# Patient Record
Sex: Male | Born: 1980 | Race: White | Hispanic: No | Marital: Married | Smoking: Current every day smoker
Health system: Southern US, Community
[De-identification: ages and names within clinical notes are randomized; demographics above are authoritative.]

## PROBLEM LIST (undated history)

## (undated) DIAGNOSIS — T4145XA Adverse effect of unspecified anesthetic, initial encounter: Secondary | ICD-10-CM

## (undated) DIAGNOSIS — K219 Gastro-esophageal reflux disease without esophagitis: Secondary | ICD-10-CM

## (undated) DIAGNOSIS — S4292XA Fracture of left shoulder girdle, part unspecified, initial encounter for closed fracture: Secondary | ICD-10-CM

## (undated) DIAGNOSIS — T8859XA Other complications of anesthesia, initial encounter: Secondary | ICD-10-CM

## (undated) DIAGNOSIS — T300 Burn of unspecified body region, unspecified degree: Secondary | ICD-10-CM

## (undated) DIAGNOSIS — S329XXA Fracture of unspecified parts of lumbosacral spine and pelvis, initial encounter for closed fracture: Secondary | ICD-10-CM

---

## 2005-08-22 HISTORY — PX: TONSILLECTOMY: SUR1361

## 2005-09-13 ENCOUNTER — Ambulatory Visit (HOSPITAL_COMMUNITY): Admission: RE | Admit: 2005-09-13 | Discharge: 2005-09-13 | Payer: Self-pay | Admitting: Otolaryngology

## 2005-09-13 ENCOUNTER — Ambulatory Visit (HOSPITAL_BASED_OUTPATIENT_CLINIC_OR_DEPARTMENT_OTHER): Admission: RE | Admit: 2005-09-13 | Discharge: 2005-09-14 | Payer: Self-pay | Admitting: Otolaryngology

## 2012-07-23 ENCOUNTER — Emergency Department: Payer: Self-pay | Admitting: Emergency Medicine

## 2012-07-23 LAB — COMPREHENSIVE METABOLIC PANEL
Albumin: 4.2 g/dL (ref 3.4–5.0)
Alkaline Phosphatase: 87 U/L (ref 50–136)
Calcium, Total: 9.1 mg/dL (ref 8.5–10.1)
Glucose: 108 mg/dL — ABNORMAL HIGH (ref 65–99)
Osmolality: 276 (ref 275–301)
Sodium: 138 mmol/L (ref 136–145)

## 2012-07-23 LAB — CBC
MCH: 34.3 pg — ABNORMAL HIGH (ref 26.0–34.0)
MCV: 97 fL (ref 80–100)
Platelet: 180 10*3/uL (ref 150–440)
RBC: 4.8 10*6/uL (ref 4.40–5.90)
RDW: 13.4 % (ref 11.5–14.5)

## 2012-07-23 LAB — URINALYSIS, COMPLETE
Nitrite: NEGATIVE
Protein: 30
Specific Gravity: 1.023 (ref 1.003–1.030)
WBC UR: 1 /HPF (ref 0–5)

## 2012-07-23 LAB — LIPASE, BLOOD: Lipase: 98 U/L (ref 73–393)

## 2012-07-26 ENCOUNTER — Telehealth (INDEPENDENT_AMBULATORY_CARE_PROVIDER_SITE_OTHER): Payer: Self-pay | Admitting: General Surgery

## 2012-07-26 ENCOUNTER — Emergency Department (HOSPITAL_COMMUNITY): Payer: BC Managed Care – PPO

## 2012-07-26 ENCOUNTER — Encounter (HOSPITAL_COMMUNITY): Payer: Self-pay | Admitting: *Deleted

## 2012-07-26 ENCOUNTER — Emergency Department (HOSPITAL_COMMUNITY)
Admission: EM | Admit: 2012-07-26 | Discharge: 2012-07-26 | Disposition: A | Discharge status: Home / Self Care | Payer: BC Managed Care – PPO | Attending: Emergency Medicine | Admitting: Emergency Medicine

## 2012-07-26 DIAGNOSIS — R1011 Right upper quadrant pain: Secondary | ICD-10-CM

## 2012-07-26 DIAGNOSIS — K8020 Calculus of gallbladder without cholecystitis without obstruction: Secondary | ICD-10-CM | POA: Insufficient documentation

## 2012-07-26 DIAGNOSIS — F17200 Nicotine dependence, unspecified, uncomplicated: Secondary | ICD-10-CM | POA: Insufficient documentation

## 2012-07-26 LAB — CBC
Hemoglobin: 15.8 g/dL (ref 13.0–17.0)
MCH: 33.6 pg (ref 26.0–34.0)
MCV: 95.1 fL (ref 78.0–100.0)
RBC: 4.7 MIL/uL (ref 4.22–5.81)

## 2012-07-26 LAB — COMPREHENSIVE METABOLIC PANEL
ALT: 33 U/L (ref 0–53)
CO2: 27 mEq/L (ref 19–32)
Calcium: 9.5 mg/dL (ref 8.4–10.5)
Creatinine, Ser: 0.97 mg/dL (ref 0.50–1.35)
GFR calc Af Amer: >90 mL/min (ref >90)
GFR calc non Af Amer: >90 mL/min (ref >90)
Glucose, Bld: 97 mg/dL (ref 70–99)
Sodium: 139 mEq/L (ref 135–145)

## 2012-07-26 MED ORDER — HYDROMORPHONE HCL PF 1 MG/ML IJ SOLN
1.0000 mg | Freq: Once | INTRAMUSCULAR | Status: AC
  Administered 2012-07-26: 1 mg via INTRAVENOUS
  Filled 2012-07-26: qty 1

## 2012-07-26 MED ORDER — OXYCODONE HCL 5 MG PO TABS: 5.0000 mg | ORAL_TABLET | ORAL | Status: AC | PRN

## 2012-07-26 MED ORDER — SODIUM CHLORIDE 0.9 % IV SOLN
Freq: Once | INTRAVENOUS | Status: AC
  Administered 2012-07-26: 22:00:00 via INTRAVENOUS

## 2012-07-26 MED ORDER — SODIUM CHLORIDE 0.9 % IV BOLUS (SEPSIS)
1000.0000 mL | Freq: Once | INTRAVENOUS | Status: AC
  Administered 2012-07-26: 1000 mL via INTRAVENOUS

## 2012-07-26 MED ORDER — ONDANSETRON HCL 4 MG/2ML IJ SOLN
4.0000 mg | Freq: Once | INTRAMUSCULAR | Status: AC
  Administered 2012-07-26: 4 mg via INTRAVENOUS
  Filled 2012-07-26: qty 2

## 2012-07-26 MED ORDER — ONDANSETRON 8 MG PO TBDP: 8.0000 mg | ORAL_TABLET | Freq: Three times a day (TID) | ORAL | Status: AC | PRN

## 2012-07-26 NOTE — Telephone Encounter (Signed)
[name] (spouse) called in stated Steve [name] is in pain, was seen last week in an urgent office facility but was not given any medication for pain. He was told his gallbladder was inflamed. She wanted to know if there was an earlier appointment than what has already been set up with Dr. Michaell Cowing on 07/28/12. Checked system and there was not an available slot. Advised for him to try  Aleve or Advil for the pain until seen on Friday. And advised to not take any Tylenol.

## 2012-07-26 NOTE — ED Provider Notes (Addendum)
History     CSN: [id number]  Arrival date & time 07/26/12  1743   First MD Initiated Contact with Patient 07/26/12 1826      Chief Complaint  Patient presents with  . Abdominal Pain  . Cholelithiasis    (Consider location/radiation/quality/duration/timing/severity/associated sxs/prior treatment) The history is provided by the patient.   the patient reports a recent diagnosis of gallstones an outside hospital.  He reports development of severe pain with associated nausea that occurred today.  Said no fevers or chills.  Reports his pain is moderate to severe at this time and located in his right upper quadrant.  His pain continues across his abdomen at this time.  He's had no fevers or chills.  Has appointment with Gen. surgery in 2 days.  His pain became severe today and thus he presents to the emergency department for evaluation.  History reviewed. No pertinent past medical history.  History reviewed. No pertinent past surgical history.  History reviewed. No pertinent family history.  History  Substance Use Topics  . Smoking status: Current Everyday Smoker -- 2.0 packs/day for 10 years    Types: Cigarettes  . Smokeless tobacco: Not on file  . Alcohol Use: Yes     occasional      Review of Systems  Gastrointestinal: Positive for abdominal pain.  All other systems reviewed and are negative.    Allergies  Review of patient's allergies indicates no known allergies.  Home Medications   Current Outpatient Rx  Name Route Sig Dispense Refill  . NAPROXEN SODIUM 220 MG PO TABS Oral Take 1,100 mg by mouth once.     . OMEPRAZOLE 20 MG PO CPDR Oral Take 20 mg by mouth daily.      BP 158/91  Pulse 50  Temp 97.7 F (36.5 C) (Oral)  Resp 18  SpO2 98%  Physical Exam  Nursing note and vitals reviewed. Constitutional: He is oriented to person, place, and time. He appears well-developed and well-nourished.  HENT:  Head: Normocephalic and atraumatic.  Eyes: EOM are normal.    Neck: Normal range of motion.  Cardiovascular: Normal rate, regular rhythm, normal heart sounds and intact distal pulses.   Pulmonary/Chest: Effort normal and breath sounds normal. No respiratory distress.  Abdominal: Soft. He exhibits no distension.  Musculoskeletal: Normal range of motion.  Neurological: He is alert and oriented to person, place, and time.  Skin: Skin is warm and dry.  Psychiatric: He has a normal mood and affect. Judgment normal.    ED Course  Procedures (including critical care time)  Labs Reviewed  CBC - Abnormal; Notable for the following:    WBC 12.5 (*)     All other components within normal limits  COMPREHENSIVE METABOLIC PANEL  LIPASE, BLOOD   US Abdomen Complete  07/26/2012  *RADIOLOGY REPORT*  Clinical Data: Abdominal pain.  Cholelithiasis.  ABDOMEN ULTRASOUND  Technique:  Complete abdominal ultrasound examination was performed including evaluation of the liver, gallbladder, bile ducts, pancreas, kidneys, spleen, IVC, and abdominal aorta.  Comparison: No comparison studies available.  Findings:  Gallbladder:  Gallbladder sludge noted with an apparent 13 mm shadowing stone wedged in the neck of the gallbladder.  There is no gallbladder wall thickening or pericholecystic fluid.  The sonographer reports no sonographic Murphy's sign.  Common Bile Duct:  Nondilated at 2 - 3 mm diameter.  Liver:  Normal.  No focal parenchymal abnormality.  No biliary dilation.  IVC:  Normal.  Pancreas:  Normal.  Spleen:  Normal.  Right kidney:  11.0 cm in long axis.  Normal.  Left kidney:  12.2 cm in long axis.  Normal.  Abdominal Aorta:  No aneurysm.  IMPRESSION: 13 mm stone appears to be lodged in the neck of the gallbladder without gallbladder wall thickening or pericholecystic fluid.  The sonographer reports no sonographic Murphy's sign.   Original Report Authenticated By: ERIC A. MANSELL, M.D.     I personally reviewed the imaging tests through PACS system  I reviewed available  ER/hospitalization records thought the EMR   1. Cholelithiasis       MDM  9:57 PM Patient has ongoing pain at this time I'm having difficulty getting him pain-free.  I will consult general surgery for evaluation the bedside.  The patient would benefit from cholecystectomy    Lyanne Co, MD 07/26/12 2159  10:52 PM Surgery saw the pt. He wants to go home now. Home with [facility] follow on Friday as scheduled  Lyanne Co, MD 07/26/12 2253

## 2012-07-26 NOTE — Consult Note (Signed)
Reason for Consult:gallstones Referring Physician: [name] is an 31 y.o. male.  HPI: Asked to see pt at request of Steve [name] due to RUQ abdominal pain for last 6 hours.   Known history of gallstones. To see Steve [name] on Friday but pain worsened tonight.  Had attack on Sunday similar lasting hours.  Pain is severe and sharp but gone now.  Pt received pain meds.    History reviewed. No pertinent past medical history.  History reviewed. No pertinent past surgical history.  History reviewed. No pertinent family history.  Social History:  reports that he has been smoking Cigarettes.  He has a 20 pack-year smoking history. He does not have any smokeless tobacco history on file. He reports that he drinks alcohol. His drug history not on file.  Allergies: No Known Allergies  Medications: I have reviewed the patient's current medications.  Results for orders placed during the hospital encounter of 07/26/12 (from the past 48 hour(s))  CBC     Status: Abnormal   Collection Time   07/26/12  7:00 PM      Component Value Range Comment   WBC 12.5 (*) 4.0 - 10.5 K/uL    RBC 4.70  4.22 - 5.81 MIL/uL    Hemoglobin 15.8  13.0 - 17.0 g/dL    HCT 16.1  09.6 - 04.5 %    MCV 95.1  78.0 - 100.0 fL    MCH 33.6  26.0 - 34.0 pg    MCHC 35.3  30.0 - 36.0 g/dL    RDW 40.9  81.1 - 91.4 %    Platelets 198  150 - 400 K/uL   COMPREHENSIVE METABOLIC PANEL     Status: Normal   Collection Time   07/26/12  7:00 PM      Component Value Range Comment   Sodium 139  135 - 145 mEq/L    Potassium 3.8  3.5 - 5.1 mEq/L    Chloride 102  96 - 112 mEq/L    CO2 27  19 - 32 mEq/L    Glucose, Bld 97  70 - 99 mg/dL    BUN 13  6 - 23 mg/dL    Creatinine, Ser 7.82  0.50 - 1.35 mg/dL    Calcium 9.5  8.4 - 95.6 mg/dL    Total Protein 7.1  6.0 - 8.3 g/dL    Albumin 4.3  3.5 - 5.2 g/dL    AST 28  0 - 37 U/L    ALT 33  0 - 53 U/L    Alkaline Phosphatase 75  39 - 117 U/L    Total Bilirubin 0.3  0.3 - 1.2 mg/dL    GFR calc non Af Amer >90  >90 mL/min    GFR calc Af Amer >90  >90 mL/min   LIPASE, BLOOD     Status: Normal   Collection Time   07/26/12  7:00 PM      Component Value Range Comment   Lipase 33  11 - 59 U/L     US Abdomen Complete  07/26/2012  *RADIOLOGY REPORT*  Clinical Data: Abdominal pain.  Cholelithiasis.  ABDOMEN ULTRASOUND  Technique:  Complete abdominal ultrasound examination was performed including evaluation of the liver, gallbladder, bile ducts, pancreas, kidneys, spleen, IVC, and abdominal aorta.  Comparison: No comparison studies available.  Findings:  Gallbladder:  Gallbladder sludge noted with an apparent 13 mm shadowing stone wedged in the neck of the gallbladder.  There is no gallbladder wall  thickening or pericholecystic fluid.  The sonographer reports no sonographic Murphy's sign.  Common Bile Duct:  Nondilated at 2 - 3 mm diameter.  Liver:  Normal.  No focal parenchymal abnormality.  No biliary dilation.  IVC:  Normal.  Pancreas:  Normal.  Spleen:  Normal.  Right kidney:  11.0 cm in long axis.  Normal.  Left kidney:  12.2 cm in long axis.  Normal.  Abdominal Aorta:  No aneurysm.  IMPRESSION: 13 mm stone appears to be lodged in the neck of the gallbladder without gallbladder wall thickening or pericholecystic fluid.  The sonographer reports no sonographic Murphy's sign.   Original Report Authenticated By: ERIC A. MANSELL, M.D.     Review of Systems  Constitutional: Negative.   HENT: Negative.   Eyes: Negative.   Respiratory: Negative.   Cardiovascular: Negative.   Gastrointestinal: Positive for abdominal pain.  Genitourinary: Negative.   Musculoskeletal: Negative.   Skin: Negative.   Neurological: Negative.   Endo/Heme/Allergies: Negative.    Blood pressure 158/91, pulse 50, temperature 97.7 F (36.5 C), temperature source Oral, resp. rate 18, SpO2 98.00%. Physical Exam  Constitutional: He is oriented to person, place, and time. He appears well-developed and  well-nourished.  HENT:  Head: Normocephalic and atraumatic.  Eyes: EOM are normal. Pupils are equal, round, and reactive to light.  Neck: Normal range of motion. Neck supple.  Cardiovascular: Normal rate and regular rhythm.   Respiratory: Effort normal and breath sounds normal.  GI: Soft. Bowel sounds are normal. He exhibits no distension. There is no tenderness. There is no rebound and no guarding.  Musculoskeletal: Normal range of motion.  Neurological: He is alert and oriented to person, place, and time.  Skin: Skin is warm and dry.  Psychiatric: He has a normal mood and affect. His behavior is normal. Judgment and thought content normal.    Assessment/Plan: Symptomatic cholelithiasis.  I offered admission tonight  for lap cholecystectomy  Thursday but pt wishes to go home and keep appt with Steve [name] on Friday.  I told him to call if symptoms return or get worse.  Low fat diet recommended.  Risks of progression of disease include bleeding,  Infection ,  Open procedure.  Worsening of condition.  He and his wife understands but he wants to go home.    Steve [name] A. 07/26/2012, 10:55 PM

## 2012-07-26 NOTE — ED Notes (Signed)
Pt alert and oriented x4. Respirations even and unlabored, bilateral symmetrical rise and fall of chest. Skin warm and dry. In no acute distress. Denies needs.   

## 2012-07-26 NOTE — ED Notes (Signed)
Pt reports being seen at [facility] on Sunday 9/1, dx with gall stone and inflammation, pt has appointment with surgeon this Friday. Pt reports pain eased off since Monday and then returned this afternoon. Pain across upper abdomen 10/10. Denies n/v/d. Reports some lightheadedness. Occasionally drinker.

## 2012-07-26 NOTE — ED Notes (Signed)
md at bedside  Pt alert and oriented x4. Respirations even and unlabored, bilateral symmetrical rise and fall of chest. Skin warm and dry. In no acute distress. Denies needs.   

## 2012-07-28 ENCOUNTER — Encounter (INDEPENDENT_AMBULATORY_CARE_PROVIDER_SITE_OTHER): Payer: Self-pay | Admitting: Surgery

## 2012-07-28 ENCOUNTER — Ambulatory Visit (INDEPENDENT_AMBULATORY_CARE_PROVIDER_SITE_OTHER): Payer: BC Managed Care – PPO | Admitting: Surgery

## 2012-07-28 VITALS — BP 136/74 | HR 57 | Temp 97.7°F | Ht 74.0 in | Wt 238.0 lb

## 2012-07-28 DIAGNOSIS — K8010 Calculus of gallbladder with chronic cholecystitis without obstruction: Secondary | ICD-10-CM | POA: Insufficient documentation

## 2012-07-28 DIAGNOSIS — Z720 Tobacco use: Secondary | ICD-10-CM

## 2012-07-28 NOTE — Patient Instructions (Addendum)
Cholelithiasis Cholelithiasis (also called gallstones) is a form of gallbladder disease where gallstones form in your gallbladder. The gallbladder is a non-essential organ that stores bile made in the liver, which helps digest fats. Gallstones begin as small crystals and slowly grow into stones. Gallstone pain occurs when the gallbladder spasms, and a gallstone is blocking the duct. Pain can also occur when a stone passes out of the duct.  Women are more likely to develop gallstones than men. Other factors that increase the risk of gallbladder disease are:  Having multiple pregnancies. Physicians sometimes advise removing diseased gallbladders before future pregnancies.   Obesity.   Diets heavy in fried foods and fat.   Increasing age (older than 60).   Prolonged use of medications containing male hormones.   Diabetes mellitus.   Rapid weight loss.   Family history of gallstones (heredity).  SYMPTOMS  Feeling sick to your stomach (nauseous).   Abdominal pain.   Yellowing of the skin (jaundice).   Sudden pain. It may persist from several minutes to several hours.   Worsening pain with deep breathing or when jarred.   Fever.   Tenderness to the touch.  In some cases, when gallstones do not move into the bile duct, people have no pain or symptoms. These are called "silent" gallstones. TREATMENT In severe cases, emergency surgery may be required. HOME CARE INSTRUCTIONS   Only take over-the-counter or prescription medicines for pain, discomfort, or fever as directed by your caregiver.   Follow a low-fat diet until seen again. Fat causes the gallbladder to contract, which can result in pain.   Follow up as instructed. Attacks are almost always recurrent and surgery is usually required for permanent treatment.  SEEK IMMEDIATE MEDICAL CARE IF:   Your pain increases and is not controlled by medications.   You have an oral temperature above 102 F (38.9 C), not controlled by  medication.   You develop nausea and vomiting.  MAKE SURE YOU:   Understand these instructions.   Will watch your condition.   Will get help right away if you are not doing well or get worse.  Document Released: 11/04/2005 Document Revised: 10/28/2011 Document Reviewed: 01/07/2011 [employer] Patient Information 2012 [employer]. 

## 2012-07-28 NOTE — Progress Notes (Signed)
Subjective:     Patient ID: Steve [name], male   DOB: May 23, 1981, 31 y.o.   MRN: [id number]  HPI  Steve [name]  10-23-1981 [id number]  Patient Care Team: Provider Default, MD as PCP - General  This patient is a 32 y.o.male who presents today for surgical evaluation.   Reason for visit: Abdominal pain.  Probable gallstones etiology  Pleasant active smoking male.  Has chronic heartburn but usually well controlled.  He had a severe episode of upper abdominal pain.  Nausea and vomiting.  His son had an episode.  They thought was gastroenteritis.  His son had a lot of diarrhea.  He did not.  He went to the emergency room.  He is found to have gallstones.  CT scan did not show any cholecystitis or other abnormalities.  He had another episode after eating meatloaf a few days later.  Which emergency room.  So my partner, Dr. Luisa Hart.  Dr. Luisa Hart offered admission and surgery.  He was feeling better and wished to keep the appointment with me a few days later.  He denies any episodes of diarrhea.  Aside from the son no other sick contacts.  He does smoke but is rather active.  Eats a moderate amount of fast food.  No personal nor family history of GI/colon cancer, inflammatory bowel disease, irritable bowel syndrome, allergy such as Celiac Sprue, dietary/dairy problems, colitis, ulcers nor gastritis.  No other recent sick contacts/gastroenteritis.  No travel outside the country.  No changes in diet.    Patient Active Problem List  Diagnosis  . Chronic cholecystitis with calculus    History reviewed. No pertinent past medical history.  Past Surgical History  Procedure Date  . Tonsillectomy 08/2005    History   Social History  . Marital Status: Married    Spouse Name: N/A    Number of Children: N/A  . Years of Education: N/A   Occupational History  . Not on file.   Social History Main Topics  . Smoking status: Current Everyday Smoker -- 2.0 packs/day for 10 years    Types:  Cigarettes  . Smokeless tobacco: Not on file  . Alcohol Use: Yes     occasional  . Drug Use: No  . Sexually Active:    Other Topics Concern  . Not on file   Social History Narrative  . No narrative on file    Family History  Problem Relation Age of Onset  . Heart disease Father     Current Outpatient Prescriptions  Medication Sig Dispense Refill  . naproxen sodium (ANAPROX) 220 MG tablet Take 1,100 mg by mouth once.       Marland Kitchen omeprazole (PRILOSEC) 20 MG capsule Take 20 mg by mouth daily.      . ondansetron (ZOFRAN ODT) 8 MG disintegrating tablet Take 1 tablet (8 mg total) by mouth every 8 (eight) hours as needed for nausea.  10 tablet  0  . oxyCODONE (ROXICODONE) 5 MG immediate release tablet Take 1 tablet (5 mg total) by mouth every 4 (four) hours as needed for pain.  30 tablet  0     No Known Allergies  BP 136/74  Pulse 57  Temp 97.7 F (36.5 C) (Temporal)  Ht 6\' 2"  (1.88 m)  Wt 238 lb (107.956 kg)  BMI 30.56 kg/m2  SpO2 97%  US Abdomen Complete  07/26/2012  *RADIOLOGY REPORT*  Clinical Data: Abdominal pain.  Cholelithiasis.  ABDOMEN ULTRASOUND  Technique:  Complete abdominal ultrasound examination  was performed including evaluation of the liver, gallbladder, bile ducts, pancreas, kidneys, spleen, IVC, and abdominal aorta.  Comparison: No comparison studies available.  Findings:  Gallbladder:  Gallbladder sludge noted with an apparent 13 mm shadowing stone wedged in the neck of the gallbladder.  There is no gallbladder wall thickening or pericholecystic fluid.  The sonographer reports no sonographic Murphy's sign.  Common Bile Duct:  Nondilated at 2 - 3 mm diameter.  Liver:  Normal.  No focal parenchymal abnormality.  No biliary dilation.  IVC:  Normal.  Pancreas:  Normal.  Spleen:  Normal.  Right kidney:  11.0 cm in long axis.  Normal.  Left kidney:  12.2 cm in long axis.  Normal.  Abdominal Aorta:  No aneurysm.  IMPRESSION: 13 mm stone appears to be lodged in the neck of the  gallbladder without gallbladder wall thickening or pericholecystic fluid.  The sonographer reports no sonographic Murphy's sign.   Original Report Authenticated By: ERIC A. MANSELL, M.D.      Review of Systems  Constitutional: Negative for fever, chills and diaphoresis.  HENT: Negative for nosebleeds, sore throat, facial swelling, mouth sores, trouble swallowing and ear discharge.   Eyes: Negative for photophobia, discharge and visual disturbance.  Respiratory: Negative for cough, choking, chest tightness, shortness of breath and stridor.   Cardiovascular: Negative for chest pain and palpitations.  Gastrointestinal: Positive for nausea, vomiting and abdominal pain. Negative for diarrhea, constipation, blood in stool, abdominal distention, anal bleeding and rectal pain.  Genitourinary: Negative for dysuria, urgency, difficulty urinating and testicular pain.  Musculoskeletal: Negative for myalgias, back pain, arthralgias and gait problem.  Skin: Negative for color change, pallor, rash and wound.  Neurological: Negative for dizziness, speech difficulty, weakness, numbness and headaches.  Hematological: Negative for adenopathy. Does not bruise/bleed easily.  Psychiatric/Behavioral: Negative for hallucinations, confusion and agitation.       Objective:   Physical Exam  Constitutional: He is oriented to person, place, and time. He appears well-developed and well-nourished. No distress.  HENT:  Head: Normocephalic.  Mouth/Throat: Oropharynx is clear and moist. No oropharyngeal exudate.  Eyes: Conjunctivae and EOM are normal. Pupils are equal, round, and reactive to light. No scleral icterus.  Neck: Normal range of motion. Neck supple. No tracheal deviation present.  Cardiovascular: Normal rate, regular rhythm and intact distal pulses.   Pulmonary/Chest: Effort normal and breath sounds normal. No respiratory distress.  Abdominal: Soft. He exhibits no distension and no pulsatile liver. There is  tenderness in the right upper quadrant. There is no rigidity, no tenderness at McBurney's point and negative Murphy's sign. No hernia. Hernia confirmed negative in the ventral area, confirmed negative in the right inguinal area and confirmed negative in the left inguinal area.  Musculoskeletal: Normal range of motion. He exhibits no tenderness.  Lymphadenopathy:    He has no cervical adenopathy.       Right: No inguinal adenopathy present.       Left: No inguinal adenopathy present.  Neurological: He is alert and oriented to person, place, and time. No cranial nerve deficit. He exhibits normal muscle tone. Coordination normal.  Skin: Skin is warm and dry. No rash noted. He is not diaphoretic. No erythema. No pallor.  Psychiatric: He has a normal mood and affect. His behavior is normal. Judgment and thought content normal.       Assessment:     Classic biliary colic and gallstones with chronic right upper quadrant pain.  Strongly suspicious for chronic cholecystitis.  Plan:     ibuprofen think he requires cholecystectomy.  It is reasonable to try a single site technique.  I agree with Dr. Luisa Hart that needs to be soon.  We will try and fit them in the next week.  I gave my card should he have another severe attack that requires a more emergent surgery.  I discussed the procedure with he and his wife:  The anatomy & physiology of hepatobiliary & pancreatic function was discussed.  The pathophysiology of gallbladder dysfunction was discussed.  Natural history risks without surgery was discussed.   I feel the risks of no intervention will lead to serious problems that outweigh the operative risks; therefore, I recommended cholecystectomy to remove the pathology.  I explained laparoscopic techniques with possible need for an open approach.  Probable cholangiogram to evaluate the bilary tract was explained as well.    Risks such as bleeding, infection, abscess, leak, injury to other organs, need  for further treatment, heart attack, death, and other risks were discussed.  I noted a good likelihood this will help address the problem.  Possibility that this will not correct all abdominal symptoms was explained.  Goals of post-operative recovery were discussed as well.  We will work to minimize complications.  An educational handout further explaining the pathology and treatment options was given as well.  Questions were answered.  The patient expresses understanding & wishes to proceed with surgery.  We talked to the patient about the dangers of smoking.  We stressed that tobacco use dramatically increases the risk of peri-operative complications such as infection, tissue necrosis leaving to problems with incision/wound and organ healing, heart attack, stroke, DVT, pulmonary embolism, and death.  We noted there are programs in our community to help stop smoking.

## 2012-08-04 ENCOUNTER — Other Ambulatory Visit (INDEPENDENT_AMBULATORY_CARE_PROVIDER_SITE_OTHER): Payer: Self-pay | Admitting: Surgery

## 2012-08-04 DIAGNOSIS — K8010 Calculus of gallbladder with chronic cholecystitis without obstruction: Secondary | ICD-10-CM

## 2012-08-04 HISTORY — PX: LAPAROSCOPIC CHOLECYSTECTOMY: SUR755

## 2012-08-07 ENCOUNTER — Encounter (INDEPENDENT_AMBULATORY_CARE_PROVIDER_SITE_OTHER): Payer: Self-pay | Admitting: General Surgery

## 2012-08-22 ENCOUNTER — Encounter (INDEPENDENT_AMBULATORY_CARE_PROVIDER_SITE_OTHER): Payer: Self-pay | Admitting: Surgery

## 2012-08-22 ENCOUNTER — Ambulatory Visit (INDEPENDENT_AMBULATORY_CARE_PROVIDER_SITE_OTHER): Payer: BC Managed Care – PPO | Admitting: Surgery

## 2012-08-22 VITALS — BP 132/84 | HR 76 | Temp 98.0°F | Resp 18 | Ht 73.0 in | Wt 234.2 lb

## 2012-08-22 DIAGNOSIS — Z720 Tobacco use: Secondary | ICD-10-CM

## 2012-08-22 DIAGNOSIS — K8010 Calculus of gallbladder with chronic cholecystitis without obstruction: Secondary | ICD-10-CM

## 2012-08-22 DIAGNOSIS — F17200 Nicotine dependence, unspecified, uncomplicated: Secondary | ICD-10-CM

## 2012-08-22 NOTE — Progress Notes (Signed)
Subjective:     Patient ID: Steve [name], male   DOB: 03/15/1981, 31 y.o.   MRN: [id number]  HPI  DONTRE FASCIANO  06/09/1981 [id number]  Patient Care Team: Provider Default, MD as PCP - General  This patient is a 31 y.o.male who presents today for surgical evaluation.   The patient returns three weeks after laparoscopic cholecystectomy.  Feeling well.  Back to work.  No nausea or vomiting.  Daily bowel movements.  Here today with his wife who works in our office.  Overall feels fine.  Patient Active Problem List  Diagnosis  . Chronic cholecystitis with calculus  . Tobacco abuse    No past medical history on file.  Past Surgical History  Procedure Date  . Tonsillectomy 08/2005  . Laparoscopic cholecystectomy 08/04/2012    History   Social History  . Marital Status: Married    Spouse Name: N/A    Number of Children: N/A  . Years of Education: N/A   Occupational History  . Not on file.   Social History Main Topics  . Smoking status: Current Every Day Smoker -- 2.0 packs/day for 10 years    Types: Cigarettes  . Smokeless tobacco: Not on file  . Alcohol Use: Yes     occasional  . Drug Use: No  . Sexually Active:    Other Topics Concern  . Not on file   Social History Narrative  . No narrative on file    Family History  Problem Relation Age of Onset  . Heart disease Father     Current Outpatient Prescriptions  Medication Sig Dispense Refill  . naproxen sodium (ANAPROX) 220 MG tablet Take 1,100 mg by mouth once.       Marland Kitchen omeprazole (PRILOSEC) 20 MG capsule Take 20 mg by mouth daily.         No Known Allergies  There were no vitals taken for this visit.  US Abdomen Complete  07/26/2012  *RADIOLOGY REPORT*  Clinical Data: Abdominal pain.  Cholelithiasis.  ABDOMEN ULTRASOUND  Technique:  Complete abdominal ultrasound examination was performed including evaluation of the liver, gallbladder, bile ducts, pancreas, kidneys, spleen, IVC, and abdominal aorta.   Comparison: No comparison studies available.  Findings:  Gallbladder:  Gallbladder sludge noted with an apparent 13 mm shadowing stone wedged in the neck of the gallbladder.  There is no gallbladder wall thickening or pericholecystic fluid.  The sonographer reports no sonographic Murphy's sign.  Common Bile Duct:  Nondilated at 2 - 3 mm diameter.  Liver:  Normal.  No focal parenchymal abnormality.  No biliary dilation.  IVC:  Normal.  Pancreas:  Normal.  Spleen:  Normal.  Right kidney:  11.0 cm in long axis.  Normal.  Left kidney:  12.2 cm in long axis.  Normal.  Abdominal Aorta:  No aneurysm.  IMPRESSION: 13 mm stone appears to be lodged in the neck of the gallbladder without gallbladder wall thickening or pericholecystic fluid.  The sonographer reports no sonographic Murphy's sign.   Original Report Authenticated By: ERIC A. MANSELL, M.D.    REPORT OF SURGICAL PATHOLOGY FINAL DIAGNOSIS Diagnosis Gallbladder - CHRONIC CHOLECYSTITIS. - CHOLELITHIASIS. Lloyd Huger M.D. Ph.D. Pathologist, Electronic Signature (Case signed 08/07/2012) Specimen Florie Carico and Clinical Information Specimen Comment Cholecystitis w/ gallstones Specimen(s) Obtained: Gallbladder Magnus Crescenzo Received in fixative is an intact gallbladder measuring 8.5 cm in length x 3 cm across the fundus. The serosa is pale purple, smooth and dull. The gallbladder is pale purple, smooth and dull.  The gallbladder contains a moderate amount of brownish-green, thick viscous bile as well as a single 2.0 cm dark green firm granular ovoid calculus. The wall thickness measures up to 0.3 cm. The mucosa is smooth, velvety and bile stained. The cystic duct is patent. Representative sections are submitted in a single block. JC:kh 08-04-12 Report signed out from the following location(s) [facility] [address],[place] [address].CLIA:[id number]., [facility] [address], [place], [place] [address] CLIA  [id number]  Review of Systems  Constitutional: Negative for fever, chills and diaphoresis.  HENT: Negative for sore throat, trouble swallowing and neck pain.   Eyes: Negative for photophobia and visual disturbance.  Respiratory: Negative for choking and shortness of breath.   Cardiovascular: Negative for chest pain and palpitations.  Gastrointestinal: Negative for nausea, vomiting, abdominal distention, anal bleeding and rectal pain.  Genitourinary: Negative for dysuria, urgency, difficulty urinating and testicular pain.  Musculoskeletal: Negative for myalgias, arthralgias and gait problem.  Skin: Negative for color change and rash.  Neurological: Negative for dizziness, speech difficulty, weakness and numbness.  Hematological: Negative for adenopathy.  Psychiatric/Behavioral: Negative for hallucinations, confusion and agitation.       Objective:   Physical Exam  Constitutional: He is oriented to person, place, and time. He appears well-developed and well-nourished. No distress.  HENT:  Head: Normocephalic.  Mouth/Throat: Oropharynx is clear and moist. No oropharyngeal exudate.  Eyes: Conjunctivae normal and EOM are normal. Pupils are equal, round, and reactive to light. No scleral icterus.  Neck: Normal range of motion. No tracheal deviation present.  Cardiovascular: Normal rate, normal heart sounds and intact distal pulses.   Pulmonary/Chest: Effort normal. No respiratory distress.  Abdominal: Soft. He exhibits no distension. There is no tenderness. Hernia confirmed negative in the right inguinal area and confirmed negative in the left inguinal area.       Incisions clean with normal healing ridges.  No hernias  Musculoskeletal: Normal range of motion. He exhibits no tenderness.  Neurological: He is alert and oriented to person, place, and time. No cranial nerve deficit. He exhibits normal muscle tone. Coordination normal.  Skin: Skin is warm and dry. No rash noted. He is not  diaphoretic.  Psychiatric: He has a normal mood and affect. His behavior is normal.       Assessment:     Recovering well a few weeks from single site laparoscopic cholecystectomy.    Plan:     Increase activity as tolerated.  Do not push through pain.  Advanced on diet as tolerated. Bowel regimen to avoid problems.  Return to clinic p.r.n. The patient & wife expressed understanding and appreciation

## 2012-08-22 NOTE — Patient Instructions (Addendum)
Cholecystitis   Cholecystitis is swelling and irritation (inflammation) of your gallbladder. This often happens when gallstones or sludge build up in the gallbladder. Treatment is needed right away.  HOME CARE  Home care depends on how you were treated. In general:   If you were given antibiotic medicine, take it as told. Finish the medicine even if you start to feel better.   Only take medicines as told by your doctor.   Eat low-fat foods until your next doctor visit.   Keep all doctor visits as told.  GET HELP RIGHT AWAY IF:   You have more pain and medicine does not help.   Your pain moves to a different part of your belly (abdomen) or to your back.   You have a fever.   You feel sick to your stomach (nauseous).   You throw up (vomit).  MAKE SURE YOU:   Understand these instructions.   Will watch your condition.   Will get help right away if you are not doing well or get worse.  Document Released: 10/28/2011 Document Reviewed: 10/26/2011  ExitCare Patient Information 2012 [employer].

## 2012-12-21 ENCOUNTER — Encounter (INDEPENDENT_AMBULATORY_CARE_PROVIDER_SITE_OTHER): Payer: Self-pay

## 2013-10-25 ENCOUNTER — Ambulatory Visit (HOSPITAL_COMMUNITY)
Admission: RE | Admit: 2013-10-25 | Discharge: 2013-10-25 | Disposition: A | Discharge status: Home / Self Care | Payer: BC Managed Care – PPO | Source: Ambulatory Visit | Attending: Specialist | Admitting: Specialist

## 2013-10-25 ENCOUNTER — Other Ambulatory Visit (HOSPITAL_COMMUNITY): Payer: Self-pay | Admitting: Specialist

## 2013-10-25 DIAGNOSIS — T1490XA Injury, unspecified, initial encounter: Secondary | ICD-10-CM

## 2013-10-25 DIAGNOSIS — Z77018 Contact with and (suspected) exposure to other hazardous metals: Secondary | ICD-10-CM | POA: Insufficient documentation

## 2013-10-25 DIAGNOSIS — Z1389 Encounter for screening for other disorder: Secondary | ICD-10-CM | POA: Insufficient documentation

## 2015-03-11 NOTE — Consult Note (Signed)
PATIENT NAME:  Steve [name], Steve [name] MR#:  [id number] DATE OF BIRTH:  06-18-1981  DATE OF CONSULTATION:  07/24/2012  REFERRING PHYSICIAN:   CONSULTING PHYSICIAN:  Cristal Deer A. Calvert Charland, MD  REASON FOR CONSULT: Epigastric pain and gallstones on ultrasound.   HISTORY OF PRESENT ILLNESS: Steve [name] is a pleasant 34 year old male with history of one day of epigastric pain that he said began acutely at 6 o'clock p.m.  He said there was nothing precipitating it.  He describes his pain as epigastric extending to both sides.  He has never had pain like that before.  He has done nothing to make it better, but it has gotten better on its own. He is currently without pain.   He had one episode of attempted vomiting. He justifies that his son has been sick and he thought he would feel better if he threw up. Otherwise no nausea, no diarrhea, no constipation. Last bowel movement was around 6:00 which was normal and formed.  Of note, he said that he takes approximately 2 to 6 BC powder packages per day for headaches. He said he has never had any problems associated with that. Otherwise no fevers, chills, night sweats, shortness of breath, cough, chest pain, nausea, vomiting, diarrhea, constipation, hematuria, dysuria, hematemesis, or bright red blood per rectum.   PAST MEDICAL HISTORY:  1. Gastroesophageal reflux disease.  2. Tonsillitis, status post tonsillectomy.   MEDICATIONS: Prilosec.   ALLERGIES: No known drug allergies.   SOCIAL HISTORY: He is here with his wife and mother. He smokes approximately 2 packs per day of tobacco. Also a social drinker.    REVIEW OF SYSTEMS:  A complete 12-system review of systems was conducted. Positives and negatives are presented above.      PHYSICAL EXAMINATION:  VITAL SIGNS: Temperature 98, pulse 69, blood pressure 118/59, respirations 18, pulse oximetry 97% on room air.   GENERAL: No acute distress, alert and oriented times three.   HEAD: Normocephalic, atraumatic.    EYES: No jaundice. No scleral icterus.   FACE: Normal external nose.  Normal external ears.   NECK: No obvious masses.   CHEST: Lungs clear to auscultation, moving air well.   HEART: Regular rate and rhythm.   ABDOMEN: Soft, nontender and nondistended. No obvious masses. No obvious peritonitis. No Murphy's sign.   EXTREMITIES: Moves all extremities well. Strength five out of five.   NEUROLOGIC: Cranial nerves II through XII grossly intact. Sensation intact to all four extremities.   LABORATORY DATA: Labs are significant for white cell count of 15.3. Hemoglobin and hematocrit 16.5 and 46.5. Platelets are 180.   Urinalysis is normal.   LFTs and renal panel and lipase are all normal.   Ultrasound right upper quadrant shows a 1.6-cm stone, slightly thickened gallbladder wall of 4.9 cm. No pericholecystic fluid. No Murphy's sign.   ASSESSMENT AND PLAN: Steve [name] is a pleasant 34 year old male with gallstones and one day of epigastric pain. He does have an elevated white blood cell count but is completely nontender on exam and ultrasound findings are relatively unimpressive. I do think he may have pain due to gallstones, but I do not think he clinically has acute cholecystitis.  Concern for peptic ulcer disease in the context of how much NSAIDs he takes for his headaches. He was advised him to take less in the future.  He is to follow up with me in the office but is okay for discharge if he tolerates p.o.  If he continues to have pain  he may require cholecystectomy for symptomatic cholelithiasis in the future, but no need for immediate surgery.     ____________________________ Si Raiderhristopher A. Verina Galeno, MD cal:bjt D: 07/24/2012 03:59:34 ET T: 07/25/2012 09:18:25 ET JOB#: [id number]  cc: Cristal Deerhristopher A. Timonthy Hovater, MD, <Dictator> Jarvis NewcomerHRISTOPHER A Ka Flammer MD ELECTRONICALLY SIGNED 07/29/2012 7:47

## 2015-05-05 ENCOUNTER — Emergency Department (HOSPITAL_COMMUNITY): Payer: Worker's Compensation

## 2015-05-05 ENCOUNTER — Inpatient Hospital Stay (HOSPITAL_COMMUNITY): Payer: Worker's Compensation

## 2015-05-05 ENCOUNTER — Encounter (HOSPITAL_COMMUNITY): Payer: Self-pay | Admitting: Emergency Medicine

## 2015-05-05 ENCOUNTER — Inpatient Hospital Stay (HOSPITAL_COMMUNITY)
Admission: EM | Admit: 2015-05-05 | Discharge: 2015-05-09 | DRG: 563 | Disposition: A | Discharge status: Rehabilitation Facility | Payer: Worker's Compensation | Attending: General Surgery | Admitting: General Surgery

## 2015-05-05 DIAGNOSIS — S43005D Unspecified dislocation of left shoulder joint, subsequent encounter: Secondary | ICD-10-CM | POA: Diagnosis not present

## 2015-05-05 DIAGNOSIS — M25512 Pain in left shoulder: Secondary | ICD-10-CM | POA: Diagnosis present

## 2015-05-05 DIAGNOSIS — E871 Hypo-osmolality and hyponatremia: Secondary | ICD-10-CM | POA: Diagnosis not present

## 2015-05-05 DIAGNOSIS — S3282XD Multiple fractures of pelvis without disruption of pelvic ring, subsequent encounter for fracture with routine healing: Secondary | ICD-10-CM | POA: Diagnosis not present

## 2015-05-05 DIAGNOSIS — S329XXS Fracture of unspecified parts of lumbosacral spine and pelvis, sequela: Secondary | ICD-10-CM | POA: Diagnosis not present

## 2015-05-05 DIAGNOSIS — S4292XA Fracture of left shoulder girdle, part unspecified, initial encounter for closed fracture: Secondary | ICD-10-CM

## 2015-05-05 DIAGNOSIS — Y9269 Other specified industrial and construction area as the place of occurrence of the external cause: Secondary | ICD-10-CM

## 2015-05-05 DIAGNOSIS — S329XXD Fracture of unspecified parts of lumbosacral spine and pelvis, subsequent encounter for fracture with routine healing: Secondary | ICD-10-CM | POA: Diagnosis not present

## 2015-05-05 DIAGNOSIS — S32501S Unspecified fracture of right pubis, sequela: Secondary | ICD-10-CM | POA: Diagnosis not present

## 2015-05-05 DIAGNOSIS — Z7982 Long term (current) use of aspirin: Secondary | ICD-10-CM

## 2015-05-05 DIAGNOSIS — T404X5A Adverse effect of other synthetic narcotics, initial encounter: Secondary | ICD-10-CM | POA: Diagnosis not present

## 2015-05-05 DIAGNOSIS — T2200XA Burn of unspecified degree of shoulder and upper limb, except wrist and hand, unspecified site, initial encounter: Secondary | ICD-10-CM | POA: Diagnosis present

## 2015-05-05 DIAGNOSIS — D62 Acute posthemorrhagic anemia: Secondary | ICD-10-CM | POA: Diagnosis not present

## 2015-05-05 DIAGNOSIS — S381XXA Crushing injury of abdomen, lower back, and pelvis, initial encounter: Secondary | ICD-10-CM | POA: Diagnosis present

## 2015-05-05 DIAGNOSIS — T310 Burns involving less than 10% of body surface: Secondary | ICD-10-CM | POA: Diagnosis present

## 2015-05-05 DIAGNOSIS — F17210 Nicotine dependence, cigarettes, uncomplicated: Secondary | ICD-10-CM | POA: Diagnosis present

## 2015-05-05 DIAGNOSIS — T2133XD Burn of third degree of upper back, subsequent encounter: Secondary | ICD-10-CM | POA: Diagnosis not present

## 2015-05-05 DIAGNOSIS — E875 Hyperkalemia: Secondary | ICD-10-CM | POA: Diagnosis present

## 2015-05-05 DIAGNOSIS — T148XXA Other injury of unspecified body region, initial encounter: Secondary | ICD-10-CM

## 2015-05-05 DIAGNOSIS — S329XXG Fracture of unspecified parts of lumbosacral spine and pelvis, subsequent encounter for fracture with delayed healing: Secondary | ICD-10-CM | POA: Diagnosis not present

## 2015-05-05 DIAGNOSIS — S0081XA Abrasion of other part of head, initial encounter: Secondary | ICD-10-CM | POA: Diagnosis present

## 2015-05-05 DIAGNOSIS — S32599A Other specified fracture of unspecified pubis, initial encounter for closed fracture: Secondary | ICD-10-CM

## 2015-05-05 DIAGNOSIS — K5900 Constipation, unspecified: Secondary | ICD-10-CM | POA: Diagnosis not present

## 2015-05-05 DIAGNOSIS — Z79899 Other long term (current) drug therapy: Secondary | ICD-10-CM | POA: Diagnosis not present

## 2015-05-05 DIAGNOSIS — S42309A Unspecified fracture of shaft of humerus, unspecified arm, initial encounter for closed fracture: Secondary | ICD-10-CM

## 2015-05-05 DIAGNOSIS — N179 Acute kidney failure, unspecified: Secondary | ICD-10-CM | POA: Diagnosis present

## 2015-05-05 DIAGNOSIS — S32591A Other specified fracture of right pubis, initial encounter for closed fracture: Secondary | ICD-10-CM | POA: Diagnosis present

## 2015-05-05 DIAGNOSIS — S43005S Unspecified dislocation of left shoulder joint, sequela: Secondary | ICD-10-CM | POA: Diagnosis not present

## 2015-05-05 DIAGNOSIS — T300 Burn of unspecified body region, unspecified degree: Secondary | ICD-10-CM

## 2015-05-05 DIAGNOSIS — S42252A Displaced fracture of greater tuberosity of left humerus, initial encounter for closed fracture: Principal | ICD-10-CM | POA: Diagnosis present

## 2015-05-05 DIAGNOSIS — S32810A Multiple fractures of pelvis with stable disruption of pelvic ring, initial encounter for closed fracture: Secondary | ICD-10-CM | POA: Diagnosis present

## 2015-05-05 DIAGNOSIS — S300XXA Contusion of lower back and pelvis, initial encounter: Secondary | ICD-10-CM | POA: Diagnosis present

## 2015-05-05 DIAGNOSIS — R609 Edema, unspecified: Secondary | ICD-10-CM | POA: Diagnosis not present

## 2015-05-05 DIAGNOSIS — T1490XA Injury, unspecified, initial encounter: Secondary | ICD-10-CM

## 2015-05-05 DIAGNOSIS — S7002XA Contusion of left hip, initial encounter: Secondary | ICD-10-CM | POA: Diagnosis present

## 2015-05-05 DIAGNOSIS — S329XXA Fracture of unspecified parts of lumbosacral spine and pelvis, initial encounter for closed fracture: Secondary | ICD-10-CM

## 2015-05-05 DIAGNOSIS — S43005A Unspecified dislocation of left shoulder joint, initial encounter: Secondary | ICD-10-CM | POA: Diagnosis not present

## 2015-05-05 DIAGNOSIS — M79609 Pain in unspecified limb: Secondary | ICD-10-CM | POA: Diagnosis not present

## 2015-05-05 DIAGNOSIS — T2104XA Burn of unspecified degree of lower back, initial encounter: Secondary | ICD-10-CM | POA: Diagnosis present

## 2015-05-05 DIAGNOSIS — S32592A Other specified fracture of left pubis, initial encounter for closed fracture: Secondary | ICD-10-CM

## 2015-05-05 DIAGNOSIS — T148 Other injury of unspecified body region: Secondary | ICD-10-CM | POA: Diagnosis not present

## 2015-05-05 HISTORY — DX: Burn of unspecified body region, unspecified degree: T30.0

## 2015-05-05 HISTORY — DX: Fracture of unspecified parts of lumbosacral spine and pelvis, initial encounter for closed fracture: S32.9XXA

## 2015-05-05 HISTORY — DX: Fracture of left shoulder girdle, part unspecified, initial encounter for closed fracture: S42.92XA

## 2015-05-05 LAB — PREPARE FRESH FROZEN PLASMA
UNIT DIVISION: 0
Unit division: 0

## 2015-05-05 LAB — PROTIME-INR
INR: 1.19 (ref 0.00–1.49)
PROTHROMBIN TIME: 15.3 s — AB (ref 11.6–15.2)

## 2015-05-05 LAB — COMPREHENSIVE METABOLIC PANEL
ALBUMIN: 3.9 g/dL (ref 3.5–5.0)
ALT: 133 U/L — ABNORMAL HIGH (ref 17–63)
ANION GAP: 14 (ref 5–15)
AST: 130 U/L — ABNORMAL HIGH (ref 15–41)
Alkaline Phosphatase: 75 U/L (ref 38–126)
BUN: 16 mg/dL (ref 6–20)
CALCIUM: 9.5 mg/dL (ref 8.9–10.3)
CO2: 17 mmol/L — ABNORMAL LOW (ref 22–32)
Chloride: 110 mmol/L (ref 101–111)
Creatinine, Ser: 1.45 mg/dL — ABNORMAL HIGH (ref 0.61–1.24)
GFR calc non Af Amer: >60 mL/min (ref >60)
GLUCOSE: 204 mg/dL — AB (ref 65–99)
Potassium: 3.6 mmol/L (ref 3.5–5.1)
SODIUM: 141 mmol/L (ref 135–145)
TOTAL PROTEIN: 6.8 g/dL (ref 6.5–8.1)
Total Bilirubin: 0.7 mg/dL (ref 0.3–1.2)

## 2015-05-05 LAB — CBC
HCT: 38.7 % — ABNORMAL LOW (ref 39.0–52.0)
HEMATOCRIT: 47.1 % (ref 39.0–52.0)
HEMOGLOBIN: 16.2 g/dL (ref 13.0–17.0)
HEMOGLOBIN: 13.3 g/dL (ref 13.0–17.0)
MCH: 33.4 pg (ref 26.0–34.0)
MCH: 33.6 pg (ref 26.0–34.0)
MCHC: 34.4 g/dL (ref 30.0–36.0)
MCHC: 34.4 g/dL (ref 30.0–36.0)
MCV: 97.1 fL (ref 78.0–100.0)
MCV: 97.7 fL (ref 78.0–100.0)
Platelets: 148 10*3/uL — ABNORMAL LOW (ref 150–400)
Platelets: 163 10*3/uL (ref 150–400)
RBC: 4.85 MIL/uL (ref 4.22–5.81)
RBC: 3.96 MIL/uL — ABNORMAL LOW (ref 4.22–5.81)
RDW: 12.7 % (ref 11.5–15.5)
RDW: 12.9 % (ref 11.5–15.5)
WBC: 15.8 10*3/uL — ABNORMAL HIGH (ref 4.0–10.5)
WBC: 18.1 10*3/uL — ABNORMAL HIGH (ref 4.0–10.5)

## 2015-05-05 LAB — CK: CK TOTAL: 699 U/L — AB (ref 49–397)

## 2015-05-05 LAB — I-STAT CG4 LACTIC ACID, ED: Lactic Acid, Venous: 5.26 mmol/L (ref 0.5–2.0)

## 2015-05-05 LAB — MRSA PCR SCREENING: MRSA BY PCR: NEGATIVE

## 2015-05-05 LAB — ABO/RH: ABO/RH(D): A POS

## 2015-05-05 LAB — ETHANOL: Alcohol, Ethyl (B): <5 mg/dL (ref <5)

## 2015-05-05 MED ORDER — PROPOFOL 10 MG/ML IV BOLUS
2.0000 mg/kg | Freq: Once | INTRAVENOUS | Status: AC
  Filled 2015-05-05 (×2): qty 40

## 2015-05-05 MED ORDER — HYDROMORPHONE HCL 1 MG/ML IJ SOLN
0.5000 mg | INTRAMUSCULAR | Status: DC | PRN
  Administered 2015-05-05 – 2015-05-06 (×3): 1 mg via INTRAVENOUS
  Administered 2015-05-06: 0.5 mg via INTRAVENOUS
  Administered 2015-05-06 – 2015-05-09 (×9): 1 mg via INTRAVENOUS
  Filled 2015-05-05 (×13): qty 1

## 2015-05-05 MED ORDER — ONDANSETRON HCL 4 MG/2ML IJ SOLN: 4.0000 mg | Freq: Four times a day (QID) | INTRAMUSCULAR | Status: DC | PRN

## 2015-05-05 MED ORDER — PANTOPRAZOLE SODIUM 40 MG IV SOLR
40.0000 mg | Freq: Every day | INTRAVENOUS | Status: DC
  Administered 2015-05-05 – 2015-05-06 (×2): 40 mg via INTRAVENOUS
  Filled 2015-05-05 (×3): qty 40

## 2015-05-05 MED ORDER — HYDROMORPHONE HCL 1 MG/ML IJ SOLN
1.0000 mg | INTRAMUSCULAR | Status: DC | PRN
  Administered 2015-05-05 – 2015-05-06 (×2): 1 mg via INTRAVENOUS
  Filled 2015-05-05 (×2): qty 1

## 2015-05-05 MED ORDER — ONDANSETRON HCL 4 MG PO TABS: 4.0000 mg | ORAL_TABLET | Freq: Four times a day (QID) | ORAL | Status: DC | PRN

## 2015-05-05 MED ORDER — KCL IN DEXTROSE-NACL 20-5-0.45 MEQ/L-%-% IV SOLN
INTRAVENOUS | Status: DC
  Administered 2015-05-05: 22:00:00 via INTRAVENOUS
  Filled 2015-05-05 (×2): qty 1000

## 2015-05-05 MED ORDER — SODIUM CHLORIDE 0.9 % IV BOLUS (SEPSIS)
1000.0000 mL | Freq: Once | INTRAVENOUS | Status: AC
  Administered 2015-05-05: 1000 mL via INTRAVENOUS

## 2015-05-05 MED ORDER — FENTANYL CITRATE (PF) 100 MCG/2ML IJ SOLN
50.0000 ug | Freq: Once | INTRAMUSCULAR | Status: AC
  Administered 2015-05-05: 50 ug via INTRAVENOUS
  Filled 2015-05-05: qty 2

## 2015-05-05 MED ORDER — ONDANSETRON HCL 4 MG/2ML IJ SOLN
4.0000 mg | Freq: Once | INTRAMUSCULAR | Status: AC
  Administered 2015-05-05: 4 mg via INTRAVENOUS
  Filled 2015-05-05: qty 2

## 2015-05-05 MED ORDER — PANTOPRAZOLE SODIUM 40 MG PO TBEC
40.0000 mg | DELAYED_RELEASE_TABLET | Freq: Every day | ORAL | Status: DC
  Administered 2015-05-07 – 2015-05-09 (×3): 40 mg via ORAL
  Filled 2015-05-05 (×3): qty 1

## 2015-05-05 MED ORDER — SILVER SULFADIAZINE 1 % EX CREA
TOPICAL_CREAM | Freq: Every day | CUTANEOUS | Status: DC
  Administered 2015-05-06 – 2015-05-09 (×5): via TOPICAL
  Filled 2015-05-05: qty 85

## 2015-05-05 MED ORDER — DIAZEPAM 5 MG/ML IJ SOLN
5.0000 mg | INTRAMUSCULAR | Status: DC | PRN
  Administered 2015-05-06 (×3): 5 mg via INTRAVENOUS
  Filled 2015-05-05 (×4): qty 2

## 2015-05-05 MED ORDER — HYDROMORPHONE HCL 1 MG/ML IJ SOLN
1.0000 mg | Freq: Once | INTRAMUSCULAR | Status: AC
  Administered 2015-05-05: 1 mg via INTRAVENOUS
  Filled 2015-05-05: qty 1

## 2015-05-05 MED ORDER — SODIUM CHLORIDE 0.9 % IV BOLUS (SEPSIS)
2000.0000 mL | Freq: Once | INTRAVENOUS | Status: AC
  Administered 2015-05-05: 2000 mL via INTRAVENOUS

## 2015-05-05 MED ORDER — CETYLPYRIDINIUM CHLORIDE 0.05 % MT LIQD
7.0000 mL | Freq: Two times a day (BID) | OROMUCOSAL | Status: DC
  Administered 2015-05-06 – 2015-05-08 (×3): 7 mL via OROMUCOSAL

## 2015-05-05 MED ORDER — PROPOFOL 10 MG/ML IV BOLUS
INTRAVENOUS | Status: AC | PRN
  Administered 2015-05-05: 50 mg via INTRAVENOUS
  Administered 2015-05-05 (×2): 30 mg via INTRAVENOUS
  Administered 2015-05-05: 20 mg via INTRAVENOUS

## 2015-05-05 MED ORDER — IOHEXOL 300 MG/ML  SOLN
100.0000 mL | Freq: Once | INTRAMUSCULAR | Status: AC | PRN
  Administered 2015-05-05: 100 mL via INTRAVENOUS

## 2015-05-05 NOTE — ED Notes (Signed)
Pt being transported to CT, [facility], RN with pt

## 2015-05-05 NOTE — Progress Notes (Signed)
Patient c/o intermittent numbness in right leg from hip to tip of toes. Unable to specify which aspect of his leg the numbness is located in. Pt able to move extremity without issue, full sensation, no pain present. Dr. Derrell Lolling paged. No new orders given. Will cont to monitor.

## 2015-05-05 NOTE — ED Notes (Signed)
Pt undressed, in gown, on monitor, continuous pulse oximetry and blood pressure cuff; visitor at bedside 
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
## 2015-05-05 NOTE — ED Notes (Signed)
Attempted report x2. No answer.

## 2015-05-05 NOTE — ED Notes (Signed)
Attempted report x1. 

## 2015-05-05 NOTE — Sedation Documentation (Signed)
Radiology at bedside

## 2015-05-05 NOTE — ED Notes (Signed)
Dr. Thompson at bedside. 

## 2015-05-05 NOTE — Sedation Documentation (Signed)
MD at bedside. 

## 2015-05-05 NOTE — ED Notes (Signed)
Orthopedic MD at bedside. 
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
## 2015-05-05 NOTE — ED Notes (Signed)
Dr. Thompson removed C collar. 

## 2015-05-05 NOTE — ED Notes (Signed)
MD at bedside. 
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
## 2015-05-05 NOTE — ED Notes (Signed)
MD performing FAST exam. Negative

## 2015-05-05 NOTE — ED Notes (Signed)
RN with pt in CT 
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
## 2015-05-05 NOTE — ED Notes (Signed)
Pt's family at bedside. Pt's belongings given to pt's wife. Pt has boots, pants, belt, shirt
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
## 2015-05-05 NOTE — Sedation Documentation (Signed)
Radiology at bedside to verify l shoulder placement.
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
## 2015-05-05 NOTE — ED Notes (Signed)
Ortho MD paged for consult

## 2015-05-05 NOTE — ED Notes (Signed)
Placed shoulder immobilzer to pt's left shoulder

## 2015-05-06 LAB — BASIC METABOLIC PANEL
Anion gap: 7 (ref 5–15)
Anion gap: 7 (ref 5–15)
BUN: 24 mg/dL — ABNORMAL HIGH (ref 6–20)
BUN: 19 mg/dL (ref 6–20)
CALCIUM: 7.7 mg/dL — AB (ref 8.9–10.3)
CO2: 19 mmol/L — ABNORMAL LOW (ref 22–32)
CO2: 20 mmol/L — ABNORMAL LOW (ref 22–32)
Calcium: 7.7 mg/dL — ABNORMAL LOW (ref 8.9–10.3)
Chloride: 114 mmol/L — ABNORMAL HIGH (ref 101–111)
Chloride: 108 mmol/L (ref 101–111)
Creatinine, Ser: 1.89 mg/dL — ABNORMAL HIGH (ref 0.61–1.24)
Creatinine, Ser: 1.32 mg/dL — ABNORMAL HIGH (ref 0.61–1.24)
GFR calc Af Amer: 53 mL/min — ABNORMAL LOW (ref >60)
GFR calc non Af Amer: 45 mL/min — ABNORMAL LOW (ref >60)
GLUCOSE: 169 mg/dL — AB (ref 65–99)
Glucose, Bld: 158 mg/dL — ABNORMAL HIGH (ref 65–99)
POTASSIUM: 6.3 mmol/L — AB (ref 3.5–5.1)
POTASSIUM: 4.9 mmol/L (ref 3.5–5.1)
Sodium: 140 mmol/L (ref 135–145)
Sodium: 135 mmol/L (ref 135–145)

## 2015-05-06 LAB — CBC
HEMATOCRIT: 41.1 % (ref 39.0–52.0)
HEMOGLOBIN: 14 g/dL (ref 13.0–17.0)
MCH: 33.5 pg (ref 26.0–34.0)
MCHC: 34.1 g/dL (ref 30.0–36.0)
MCV: 98.3 fL (ref 78.0–100.0)
Platelets: 181 10*3/uL (ref 150–400)
RBC: 4.18 MIL/uL — AB (ref 4.22–5.81)
RDW: 13.1 % (ref 11.5–15.5)
WBC: 14.5 10*3/uL — AB (ref 4.0–10.5)

## 2015-05-06 LAB — TYPE AND SCREEN
ABO/RH(D): A POS
ANTIBODY SCREEN: NEGATIVE
UNIT DIVISION: 0
Unit division: 0

## 2015-05-06 LAB — CK: CK TOTAL: 3619 U/L — AB (ref 49–397)

## 2015-05-06 LAB — BLOOD PRODUCT ORDER (VERBAL) VERIFICATION

## 2015-05-06 MED ORDER — SODIUM CHLORIDE 0.9 % IV BOLUS (SEPSIS)
500.0000 mL | Freq: Once | INTRAVENOUS | Status: AC
  Administered 2015-05-06: 500 mL via INTRAVENOUS

## 2015-05-06 MED ORDER — METHOCARBAMOL 750 MG PO TABS
750.0000 mg | ORAL_TABLET | Freq: Four times a day (QID) | ORAL | Status: DC | PRN
  Administered 2015-05-07 – 2015-05-08 (×5): 750 mg via ORAL
  Filled 2015-05-06 (×6): qty 1

## 2015-05-06 MED ORDER — SODIUM CHLORIDE 0.9 % IV SOLN
INTRAVENOUS | Status: DC
  Administered 2015-05-06 – 2015-05-07 (×3): via INTRAVENOUS

## 2015-05-06 MED ORDER — SODIUM CHLORIDE 0.9 % IV BOLUS (SEPSIS)
1000.0000 mL | Freq: Once | INTRAVENOUS | Status: AC
  Administered 2015-05-06: 1000 mL via INTRAVENOUS

## 2015-05-06 MED ORDER — SODIUM POLYSTYRENE SULFONATE 15 GM/60ML PO SUSP
15.0000 g | Freq: Once | ORAL | Status: AC
  Administered 2015-05-06: 15 g via ORAL
  Filled 2015-05-06: qty 60

## 2015-05-06 MED ORDER — DEXTROSE-NACL 5-0.9 % IV SOLN
INTRAVENOUS | Status: DC
  Administered 2015-05-06: 08:00:00 via INTRAVENOUS

## 2015-05-06 MED ORDER — ENOXAPARIN SODIUM 40 MG/0.4ML ~~LOC~~ SOLN
40.0000 mg | SUBCUTANEOUS | Status: DC
  Administered 2015-05-06 – 2015-05-08 (×3): 40 mg via SUBCUTANEOUS
  Filled 2015-05-06 (×5): qty 0.4

## 2015-05-06 MED ORDER — SODIUM CHLORIDE 0.9 % IV SOLN
1.0000 g | Freq: Once | INTRAVENOUS | Status: AC
  Administered 2015-05-06: 1 g via INTRAVENOUS
  Filled 2015-05-06: qty 10

## 2015-05-06 MED ORDER — OXYCODONE HCL 5 MG PO TABS
5.0000 mg | ORAL_TABLET | ORAL | Status: DC | PRN
Start: 2015-05-06 — End: 2015-05-07
  Administered 2015-05-06: 15 mg via ORAL
  Administered 2015-05-06: 10 mg via ORAL
  Administered 2015-05-06 – 2015-05-07 (×2): 15 mg via ORAL
  Filled 2015-05-06 (×2): qty 3
  Filled 2015-05-06: qty 2
  Filled 2015-05-06: qty 3
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
## 2015-05-06 NOTE — Progress Notes (Addendum)
     Subjective:  S/P closed reduction of L shoulder on 05/05/2015 due to dislocation.  Patient also has bilateral pelvic rami fractures. Patient reports pain as moderate.  Reports significant pain with turning last night.   Objective:   VITALS:   Filed Vitals:   05/06/15 0410 05/06/15 0500 05/06/15 0600 05/06/15 0700  BP: 88/54 103/58 98/60 104/57  Pulse:  89 90   Temp: 97.9 F (36.6 C)     TempSrc: Oral     Resp: 13 14 17 16   Height:      Weight:      SpO2:  97% 98%     Neurologically intact ABD soft Neurovascular intact Sensation intact distally Intact pulses distally Dorsiflexion/Plantar flexion intact L arm in sling  Lab Results  Component Value Date   WBC 14.5* 05/06/2015   HGB 14.0 05/06/2015   HCT 41.1 05/06/2015   MCV 98.3 05/06/2015   PLT 181 05/06/2015   BMET    Component Value Date/Time   NA 140 05/06/2015 0545   NA 138 07/23/2012 2139   K 6.3* 05/06/2015 0545   K 3.9 07/23/2012 2139   CL 114* 05/06/2015 0545   CL 106 07/23/2012 2139   CO2 19* 05/06/2015 0545   CO2 25 07/23/2012 2139   GLUCOSE 169* 05/06/2015 0545   GLUCOSE 108* 07/23/2012 2139   BUN 24* 05/06/2015 0545   BUN 13 07/23/2012 2139   CREATININE 1.89* 05/06/2015 0545   CREATININE 1.01 07/23/2012 2139   CALCIUM 7.7* 05/06/2015 0545   CALCIUM 9.1 07/23/2012 2139   GFRNONAA 45* 05/06/2015 0545   GFRNONAA >60 07/23/2012 2139   GFRAA 53* 05/06/2015 0545   GFRAA >60 07/23/2012 2139     Assessment/Plan:     Active Problems:   Shoulder dislocation   Up with therapy WBAT in the BLE, NWB in the LUE (sling at all times) No indication for urgent surgery at this time.  No need for NPO from ortho standpoint.  DVT prophylaxis per trauma team   Mashawn Brazil Marie 05/06/2015, 7:24 AM Cell [phone]

## 2015-05-06 NOTE — Progress Notes (Signed)
Changed arm dressing. Pt tolerated procedure well. Will continue to monitor.
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
## 2015-05-07 ENCOUNTER — Inpatient Hospital Stay (HOSPITAL_COMMUNITY): Payer: Worker's Compensation

## 2015-05-07 LAB — BASIC METABOLIC PANEL
ANION GAP: 5 (ref 5–15)
BUN: 15 mg/dL (ref 6–20)
CHLORIDE: 102 mmol/L (ref 101–111)
CO2: 22 mmol/L (ref 22–32)
CREATININE: 1.02 mg/dL (ref 0.61–1.24)
Calcium: 7.6 mg/dL — ABNORMAL LOW (ref 8.9–10.3)
GFR calc Af Amer: >60 mL/min (ref >60)
GFR calc non Af Amer: >60 mL/min (ref >60)
GLUCOSE: 124 mg/dL — AB (ref 65–99)
POTASSIUM: 4.4 mmol/L (ref 3.5–5.1)
Sodium: 129 mmol/L — ABNORMAL LOW (ref 135–145)

## 2015-05-07 MED ORDER — POLYETHYLENE GLYCOL 3350 17 G PO PACK
17.0000 g | PACK | Freq: Every day | ORAL | Status: DC
  Administered 2015-05-07 – 2015-05-08 (×2): 17 g via ORAL
  Filled 2015-05-07 (×2): qty 1

## 2015-05-07 MED ORDER — OXYCODONE HCL 5 MG PO TABS
10.0000 mg | ORAL_TABLET | ORAL | Status: DC | PRN
  Administered 2015-05-07: 15 mg via ORAL
  Administered 2015-05-07 (×2): 20 mg via ORAL
  Administered 2015-05-08: 10 mg via ORAL
  Administered 2015-05-08 (×4): 20 mg via ORAL
  Administered 2015-05-09: 15 mg via ORAL
  Administered 2015-05-09 (×2): 20 mg via ORAL
  Administered 2015-05-09: 15 mg via ORAL
  Filled 2015-05-07 (×4): qty 4
  Filled 2015-05-07: qty 3
  Filled 2015-05-07: qty 4
  Filled 2015-05-07: qty 3
  Filled 2015-05-07: qty 2
  Filled 2015-05-07: qty 4
  Filled 2015-05-07: qty 3
  Filled 2015-05-07 (×2): qty 4

## 2015-05-07 MED ORDER — TRAMADOL HCL 50 MG PO TABS
50.0000 mg | ORAL_TABLET | Freq: Four times a day (QID) | ORAL | Status: DC
  Administered 2015-05-07 – 2015-05-09 (×8): 50 mg via ORAL
  Filled 2015-05-07 (×9): qty 1

## 2015-05-07 MED ORDER — ACETAMINOPHEN 325 MG PO TABS: 650.0000 mg | ORAL_TABLET | Freq: Four times a day (QID) | ORAL | Status: DC | PRN

## 2015-05-07 NOTE — Progress Notes (Signed)
Pt transferred to 6N23 via bed from 3S11.  Pt AAO X4.  Pt on 2L O2 via [place].  Pt has 20G to rt hand with fluids infusing and 20 to Lt AC SL.  Pt has dsg in place to lt arm and lt back.  SCDs in place.  Family to bedside.  Report rcvd from [facility].  Pt has no complaints at the moment.  WIll continue to monitor.
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
## 2015-05-07 NOTE — Progress Notes (Signed)
Report called to [facility] ([facility] receiving RN on  6 north. VSS. Transferred to 0W23 via bed with personal belongings. Wife at bedside. [facility]
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
## 2015-05-07 NOTE — Progress Notes (Signed)
     Subjective:  S/P closed reduction of L shoulder on 05/05/2015 due to dislocation. Patient also has bilateral pelvic rami fractures.  Patient reports pain as mild to moderate.  Resting comfortably in bed.  Much more alert today.    Objective:   VITALS:   Filed Vitals:   05/07/15 0200 05/07/15 0322 05/07/15 0400 05/07/15 0600  BP: 126/70  123/71 115/58  Pulse:   91 99  Temp:  99.2 F (37.3 C)    TempSrc:  Oral    Resp: 16  14 10   Height:      Weight:      SpO2:   100% 95%    Neurologically intact ABD soft Neurovascular intact Sensation intact distally Intact pulses distally Dorsiflexion/Plantar flexion intact L shoulder in sling  Lab Results  Component Value Date   WBC 14.5* 05/06/2015   HGB 14.0 05/06/2015   HCT 41.1 05/06/2015   MCV 98.3 05/06/2015   PLT 181 05/06/2015   BMET    Component Value Date/Time   NA 129* 05/07/2015 0305   NA 138 07/23/2012 2139   K 4.4 05/07/2015 0305   K 3.9 07/23/2012 2139   CL 102 05/07/2015 0305   CL 106 07/23/2012 2139   CO2 22 05/07/2015 0305   CO2 25 07/23/2012 2139   GLUCOSE 124* 05/07/2015 0305   GLUCOSE 108* 07/23/2012 2139   BUN 15 05/07/2015 0305   BUN 13 07/23/2012 2139   CREATININE 1.02 05/07/2015 0305   CREATININE 1.01 07/23/2012 2139   CALCIUM 7.6* 05/07/2015 0305   CALCIUM 9.1 07/23/2012 2139   GFRNONAA >60 05/07/2015 0305   GFRNONAA >60 07/23/2012 2139   GFRAA >60 05/07/2015 0305   GFRAA >60 07/23/2012 2139     Assessment/Plan:     Active Problems:   Shoulder dislocation   AKI (acute kidney injury)   Crush injury   Closed bilateral fracture of pubic rami   Up with therapy WBAT in the BLE, NWB in the LUE (sling at all times) DVT prophylaxis per trauma CT of L shoulder ordered to better evaluate fracture, will await results to finalize treatment plan   Steve [name] Marie 05/07/2015, 8:10 AM Cell [phone]

## 2015-05-07 NOTE — Clinical Social Work Note (Signed)
Clinical Social Worker continuing to follow patient and family for support and discharge planning needs.  CSW attempted to speak with patient at bedside, however patient with visitors and sound asleep.  CSW to return to complete full assessment and SBIRT with patient.  CSW remains available for support.  Macario Golds, [place] [phone]
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
## 2015-05-07 NOTE — Progress Notes (Signed)
Rehab Admissions Coordinator Note:  Patient was screened by Steve [name] L for appropriateness for an Inpatient Acute Rehab Consult.  At this time, we are recommending Inpatient Rehab consult.  Steve [name] L 05/07/2015, 1:29 PM  I can be reached at [phone].

## 2015-05-08 LAB — BASIC METABOLIC PANEL
Anion gap: 6 (ref 5–15)
BUN: 12 mg/dL (ref 6–20)
CHLORIDE: 100 mmol/L — AB (ref 101–111)
CO2: 23 mmol/L (ref 22–32)
CREATININE: 0.93 mg/dL (ref 0.61–1.24)
Calcium: 7.6 mg/dL — ABNORMAL LOW (ref 8.9–10.3)
GFR calc Af Amer: >60 mL/min (ref >60)
GFR calc non Af Amer: >60 mL/min (ref >60)
Glucose, Bld: 123 mg/dL — ABNORMAL HIGH (ref 65–99)
Potassium: 3.9 mmol/L (ref 3.5–5.1)
Sodium: 129 mmol/L — ABNORMAL LOW (ref 135–145)

## 2015-05-08 LAB — CBC
HCT: 26.1 % — ABNORMAL LOW (ref 39.0–52.0)
HEMOGLOBIN: 9.2 g/dL — AB (ref 13.0–17.0)
MCH: 33.6 pg (ref 26.0–34.0)
MCHC: 35.2 g/dL (ref 30.0–36.0)
MCV: 95.3 fL (ref 78.0–100.0)
PLATELETS: 123 10*3/uL — AB (ref 150–400)
RBC: 2.74 MIL/uL — AB (ref 4.22–5.81)
RDW: 13 % (ref 11.5–15.5)
WBC: 10.5 10*3/uL (ref 4.0–10.5)

## 2015-05-08 MED ORDER — BISACODYL 10 MG RE SUPP
10.0000 mg | Freq: Every day | RECTAL | Status: DC | PRN
  Administered 2015-05-08: 10 mg via RECTAL
  Filled 2015-05-08: qty 1

## 2015-05-08 MED ORDER — SENNOSIDES-DOCUSATE SODIUM 8.6-50 MG PO TABS
1.0000 | ORAL_TABLET | Freq: Two times a day (BID) | ORAL | Status: DC
  Administered 2015-05-08 – 2015-05-09 (×3): 1 via ORAL
  Filled 2015-05-08 (×3): qty 1

## 2015-05-08 NOTE — Progress Notes (Signed)
     Subjective:  S/P closed reduction of L shoulder on 05/05/2015 due to dislocation.Patient reports pain as moderate.  Resting comfortably in bed.  Was limited by pain when working with PT yesterday, but was able to tolerate sitting at the side of the bed for dressing changes.  Recommending inpatient rehab at discharge. CT of L shoulder completed yesterday.  Objective:   VITALS:   Filed Vitals:   05/07/15 0800 05/07/15 1000 05/07/15 2241 05/08/15 0545  BP: 122/68 130/66 116/63 114/65  Pulse: 92 96 99 78  Temp: 98.3 F (36.8 C)  98.9 F (37.2 C) 98.7 F (37.1 C)  TempSrc: Oral  Oral Oral  Resp: 14 15 18 20   Height:      Weight:      SpO2: 97%  93% 92%    Neurologically intact ABD soft Neurovascular intact Sensation intact distally Intact pulses distally Dorsiflexion/Plantar flexion intact L arm in sling  Lab Results  Component Value Date   WBC 10.5 05/08/2015   HGB 9.2* 05/08/2015   HCT 26.1* 05/08/2015   MCV 95.3 05/08/2015   PLT 123* 05/08/2015   BMET    Component Value Date/Time   NA 129* 05/08/2015 0552   NA 138 07/23/2012 2139   K 3.9 05/08/2015 0552   K 3.9 07/23/2012 2139   CL 100* 05/08/2015 0552   CL 106 07/23/2012 2139   CO2 23 05/08/2015 0552   CO2 25 07/23/2012 2139   GLUCOSE 123* 05/08/2015 0552   GLUCOSE 108* 07/23/2012 2139   BUN 12 05/08/2015 0552   BUN 13 07/23/2012 2139   CREATININE 0.93 05/08/2015 0552   CREATININE 1.01 07/23/2012 2139   CALCIUM 7.6* 05/08/2015 0552   CALCIUM 9.1 07/23/2012 2139   GFRNONAA >60 05/08/2015 0552   GFRNONAA >60 07/23/2012 2139   GFRAA >60 05/08/2015 0552   GFRAA >60 07/23/2012 2139     Assessment/Plan:     Active Problems:   Shoulder dislocation   AKI (acute kidney injury)   Crush injury   Closed bilateral fracture of pubic rami   Up with therapy WBAT in the BLE, NWB in the LUE, sling at all times LUE DVT prophylaxis per trauma team Further treatment of L shoulder can be done on  outpatient basis, ok from ortho standpoint to transfer to inpatient rehab once cleared by trauma team.    Lynann Bologna 05/08/2015, 7:23 AM Cell [phone]
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
## 2015-05-08 NOTE — Progress Notes (Signed)
Rehab admissions - I met with patient, his wife and his mother.  All are in favor of inpatient rehab admission here in the hospital.  Mom is very concerned that SNF for rehab will cause patient extreme emotional issues.  I have called the workers comp IT consultant and I have called Deatra Canter at [phone] at [facility] to request inpatient rehab admission.  I await call back from workers comp.  My partner, Sherlyn Hay, will follow up in my absence tomorrow and she can be reached at [phone]
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
## 2015-05-09 ENCOUNTER — Inpatient Hospital Stay (HOSPITAL_COMMUNITY)
Admission: AD | Admit: 2015-05-09 | Discharge: 2015-05-30 | DRG: 988 | Disposition: A | Discharge status: Home Health | Payer: Worker's Compensation | Source: Intra-hospital | Attending: Physical Medicine & Rehabilitation | Admitting: Physical Medicine & Rehabilitation

## 2015-05-09 ENCOUNTER — Inpatient Hospital Stay (HOSPITAL_COMMUNITY): Payer: Worker's Compensation

## 2015-05-09 DIAGNOSIS — S3282XD Multiple fractures of pelvis without disruption of pelvic ring, subsequent encounter for fracture with routine healing: Principal | ICD-10-CM

## 2015-05-09 DIAGNOSIS — S42292D Other displaced fracture of upper end of left humerus, subsequent encounter for fracture with routine healing: Secondary | ICD-10-CM | POA: Diagnosis not present

## 2015-05-09 DIAGNOSIS — T148 Other injury of unspecified body region: Secondary | ICD-10-CM

## 2015-05-09 DIAGNOSIS — T40605A Adverse effect of unspecified narcotics, initial encounter: Secondary | ICD-10-CM | POA: Diagnosis present

## 2015-05-09 DIAGNOSIS — T300 Burn of unspecified body region, unspecified degree: Secondary | ICD-10-CM | POA: Diagnosis not present

## 2015-05-09 DIAGNOSIS — K5909 Other constipation: Secondary | ICD-10-CM | POA: Diagnosis present

## 2015-05-09 DIAGNOSIS — S43005S Unspecified dislocation of left shoulder joint, sequela: Secondary | ICD-10-CM | POA: Diagnosis not present

## 2015-05-09 DIAGNOSIS — S43005A Unspecified dislocation of left shoulder joint, initial encounter: Secondary | ICD-10-CM | POA: Diagnosis present

## 2015-05-09 DIAGNOSIS — R109 Unspecified abdominal pain: Secondary | ICD-10-CM

## 2015-05-09 DIAGNOSIS — S329XXG Fracture of unspecified parts of lumbosacral spine and pelvis, subsequent encounter for fracture with delayed healing: Secondary | ICD-10-CM | POA: Diagnosis not present

## 2015-05-09 DIAGNOSIS — D62 Acute posthemorrhagic anemia: Secondary | ICD-10-CM | POA: Diagnosis not present

## 2015-05-09 DIAGNOSIS — S329XXD Fracture of unspecified parts of lumbosacral spine and pelvis, subsequent encounter for fracture with routine healing: Secondary | ICD-10-CM | POA: Diagnosis not present

## 2015-05-09 DIAGNOSIS — T2133XD Burn of third degree of upper back, subsequent encounter: Secondary | ICD-10-CM

## 2015-05-09 DIAGNOSIS — T2220XD Burn of second degree of shoulder and upper limb, except wrist and hand, unspecified site, subsequent encounter: Secondary | ICD-10-CM | POA: Diagnosis not present

## 2015-05-09 DIAGNOSIS — R609 Edema, unspecified: Secondary | ICD-10-CM

## 2015-05-09 DIAGNOSIS — S329XXA Fracture of unspecified parts of lumbosacral spine and pelvis, initial encounter for closed fracture: Secondary | ICD-10-CM | POA: Diagnosis present

## 2015-05-09 DIAGNOSIS — M79609 Pain in unspecified limb: Secondary | ICD-10-CM | POA: Diagnosis not present

## 2015-05-09 DIAGNOSIS — S4292XA Fracture of left shoulder girdle, part unspecified, initial encounter for closed fracture: Secondary | ICD-10-CM

## 2015-05-09 DIAGNOSIS — Z09 Encounter for follow-up examination after completed treatment for conditions other than malignant neoplasm: Secondary | ICD-10-CM

## 2015-05-09 DIAGNOSIS — E871 Hypo-osmolality and hyponatremia: Secondary | ICD-10-CM | POA: Diagnosis present

## 2015-05-09 DIAGNOSIS — S32502S Unspecified fracture of left pubis, sequela: Secondary | ICD-10-CM

## 2015-05-09 DIAGNOSIS — F17210 Nicotine dependence, cigarettes, uncomplicated: Secondary | ICD-10-CM | POA: Diagnosis present

## 2015-05-09 DIAGNOSIS — S32501S Unspecified fracture of right pubis, sequela: Secondary | ICD-10-CM

## 2015-05-09 DIAGNOSIS — N179 Acute kidney failure, unspecified: Secondary | ICD-10-CM

## 2015-05-09 DIAGNOSIS — S329XXS Fracture of unspecified parts of lumbosacral spine and pelvis, sequela: Secondary | ICD-10-CM | POA: Diagnosis not present

## 2015-05-09 DIAGNOSIS — S43005D Unspecified dislocation of left shoulder joint, subsequent encounter: Secondary | ICD-10-CM | POA: Diagnosis present

## 2015-05-09 LAB — CBC
HEMATOCRIT: 24.9 % — AB (ref 39.0–52.0)
Hemoglobin: 8.7 g/dL — ABNORMAL LOW (ref 13.0–17.0)
MCH: 34 pg (ref 26.0–34.0)
MCHC: 34.9 g/dL (ref 30.0–36.0)
MCV: 97.3 fL (ref 78.0–100.0)
Platelets: 164 10*3/uL (ref 150–400)
RBC: 2.56 MIL/uL — ABNORMAL LOW (ref 4.22–5.81)
RDW: 13 % (ref 11.5–15.5)
WBC: 13.2 10*3/uL — ABNORMAL HIGH (ref 4.0–10.5)

## 2015-05-09 LAB — BASIC METABOLIC PANEL
Anion gap: 7 (ref 5–15)
BUN: 12 mg/dL (ref 6–20)
CHLORIDE: 100 mmol/L — AB (ref 101–111)
CO2: 23 mmol/L (ref 22–32)
Calcium: 7.8 mg/dL — ABNORMAL LOW (ref 8.9–10.3)
Creatinine, Ser: 0.83 mg/dL (ref 0.61–1.24)
GFR calc Af Amer: >60 mL/min (ref >60)
GLUCOSE: 127 mg/dL — AB (ref 65–99)
POTASSIUM: 4.1 mmol/L (ref 3.5–5.1)
Sodium: 130 mmol/L — ABNORMAL LOW (ref 135–145)

## 2015-05-09 MED ORDER — OXYCODONE HCL ER 10 MG PO T12A
20.0000 mg | EXTENDED_RELEASE_TABLET | Freq: Two times a day (BID) | ORAL | Status: DC
  Administered 2015-05-09 – 2015-05-11 (×5): 20 mg via ORAL
  Filled 2015-05-09 (×5): qty 2

## 2015-05-09 MED ORDER — SIMETHICONE 40 MG/0.6ML PO SUSP
80.0000 mg | Freq: Four times a day (QID) | ORAL | Status: DC
  Administered 2015-05-09 – 2015-05-14 (×18): 80 mg via ORAL
  Filled 2015-05-09 (×23): qty 1.2

## 2015-05-09 MED ORDER — METHOCARBAMOL 500 MG PO TABS
1000.0000 mg | ORAL_TABLET | Freq: Four times a day (QID) | ORAL | Status: DC | PRN
  Administered 2015-05-09 – 2015-05-30 (×27): 1000 mg via ORAL
  Filled 2015-05-09 (×30): qty 2

## 2015-05-09 MED ORDER — POLYETHYLENE GLYCOL 3350 17 G PO PACK
17.0000 g | PACK | Freq: Three times a day (TID) | ORAL | Status: DC
  Administered 2015-05-09 – 2015-05-11 (×5): 17 g via ORAL
  Filled 2015-05-09 (×12): qty 1

## 2015-05-09 MED ORDER — ONDANSETRON HCL 4 MG/2ML IJ SOLN: 4.0000 mg | Freq: Four times a day (QID) | INTRAMUSCULAR | Status: DC | PRN

## 2015-05-09 MED ORDER — PANTOPRAZOLE SODIUM 40 MG PO TBEC
40.0000 mg | DELAYED_RELEASE_TABLET | Freq: Every day | ORAL | Status: DC
  Administered 2015-05-10 – 2015-05-14 (×5): 40 mg via ORAL
  Filled 2015-05-09 (×5): qty 1

## 2015-05-09 MED ORDER — ALUM & MAG HYDROXIDE-SIMETH 200-200-20 MG/5ML PO SUSP
30.0000 mL | ORAL | Status: DC | PRN
  Administered 2015-05-21: 30 mL via ORAL
  Filled 2015-05-09: qty 30

## 2015-05-09 MED ORDER — OXYCODONE HCL 5 MG PO TABS
15.0000 mg | ORAL_TABLET | ORAL | Status: DC | PRN
  Administered 2015-05-09 – 2015-05-11 (×5): 20 mg via ORAL
  Administered 2015-05-11: 15 mg via ORAL
  Administered 2015-05-11 – 2015-05-16 (×15): 20 mg via ORAL
  Administered 2015-05-16: 15 mg via ORAL
  Administered 2015-05-17 – 2015-05-20 (×9): 20 mg via ORAL
  Administered 2015-05-21: 15 mg via ORAL
  Administered 2015-05-22 – 2015-05-23 (×3): 20 mg via ORAL
  Administered 2015-05-26 – 2015-05-27 (×2): 15 mg via ORAL
  Administered 2015-05-28 – 2015-05-30 (×9): 20 mg via ORAL
  Filled 2015-05-09 (×20): qty 4
  Filled 2015-05-09: qty 3
  Filled 2015-05-09 (×8): qty 4
  Filled 2015-05-09: qty 3
  Filled 2015-05-09 (×5): qty 4
  Filled 2015-05-09: qty 3
  Filled 2015-05-09 (×6): qty 4
  Filled 2015-05-09: qty 3
  Filled 2015-05-09 (×5): qty 4

## 2015-05-09 MED ORDER — BOOST PLUS PO LIQD
237.0000 mL | Freq: Three times a day (TID) | ORAL | Status: DC
  Administered 2015-05-10 – 2015-05-30 (×40): 237 mL via ORAL
  Filled 2015-05-09 (×73): qty 237

## 2015-05-09 MED ORDER — ENOXAPARIN SODIUM 40 MG/0.4ML ~~LOC~~ SOLN: 40.0000 mg | SUBCUTANEOUS | Status: DC

## 2015-05-09 MED ORDER — SILVER SULFADIAZINE 1 % EX CREA
TOPICAL_CREAM | Freq: Every day | CUTANEOUS | Status: DC
  Administered 2015-05-10 – 2015-05-19 (×10): via TOPICAL
  Filled 2015-05-09 (×7): qty 85

## 2015-05-09 MED ORDER — ONDANSETRON HCL 4 MG PO TABS: 4.0000 mg | ORAL_TABLET | Freq: Four times a day (QID) | ORAL | Status: DC | PRN

## 2015-05-09 MED ORDER — ENOXAPARIN SODIUM 40 MG/0.4ML ~~LOC~~ SOLN
40.0000 mg | SUBCUTANEOUS | Status: DC
  Administered 2015-05-09 – 2015-05-29 (×21): 40 mg via SUBCUTANEOUS
  Filled 2015-05-09 (×22): qty 0.4

## 2015-05-09 MED ORDER — TRAMADOL HCL 50 MG PO TABS
50.0000 mg | ORAL_TABLET | Freq: Four times a day (QID) | ORAL | Status: DC
  Administered 2015-05-10 – 2015-05-30 (×75): 50 mg via ORAL
  Filled 2015-05-09 (×79): qty 1

## 2015-05-09 MED ORDER — BISACODYL 10 MG RE SUPP
10.0000 mg | Freq: Every day | RECTAL | Status: DC | PRN
  Administered 2015-05-16: 10 mg via RECTAL
  Filled 2015-05-09 (×2): qty 1

## 2015-05-09 MED ORDER — FLEET ENEMA 7-19 GM/118ML RE ENEM: 1.0000 | ENEMA | Freq: Once | RECTAL | Status: AC | PRN

## 2015-05-09 MED ORDER — GUAIFENESIN-DM 100-10 MG/5ML PO SYRP: 5.0000 mL | ORAL_SOLUTION | Freq: Four times a day (QID) | ORAL | Status: DC | PRN

## 2015-05-09 MED ORDER — TRAZODONE HCL 50 MG PO TABS
25.0000 mg | ORAL_TABLET | Freq: Every evening | ORAL | Status: DC | PRN
  Administered 2015-05-10 – 2015-05-13 (×3): 50 mg via ORAL
  Filled 2015-05-09 (×3): qty 1

## 2015-05-09 MED ORDER — ALPRAZOLAM 0.5 MG PO TABS
0.5000 mg | ORAL_TABLET | Freq: Three times a day (TID) | ORAL | Status: DC | PRN
  Administered 2015-05-09 – 2015-05-22 (×9): 0.5 mg via ORAL
  Filled 2015-05-09 (×10): qty 1

## 2015-05-09 MED ORDER — DIPHENHYDRAMINE HCL 12.5 MG/5ML PO ELIX
12.5000 mg | ORAL_SOLUTION | Freq: Four times a day (QID) | ORAL | Status: DC | PRN
  Administered 2015-05-15 – 2015-05-26 (×4): 25 mg via ORAL
  Filled 2015-05-09 (×4): qty 10

## 2015-05-09 MED ORDER — SODIUM CHLORIDE 0.9 % IV BOLUS (SEPSIS)
500.0000 mL | Freq: Once | INTRAVENOUS | Status: AC
  Administered 2015-05-09: 500 mL via INTRAVENOUS
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
## 2015-05-09 NOTE — Progress Notes (Signed)
Pt. arrived to unit from 6no. at 1730. Reports 10/10 pelvic pain; moaning, distracted.  Also with c/o abdominal "cramping" and "gas" pains.  [place] and repositioning ineffective. Bladder palpated; taunt, distended at bladder region.  Assisted to EOB to attempt void. Unable to void; overflow approx. 150cc; I/O cathed for 1200cc +.
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
## 2015-05-09 NOTE — Progress Notes (Signed)
Rehab admissions - I am following pt's case today for my partner, Genie and have contacted worker's comp adjuster Daron Offer (phone: [phone]). I received verbal approval from [facility] for inpatient rehab. I then received medical clearance from trauma PA and we have an available rehab bed. We will admit pt to inpatient rehab later today.  I called into pt's room to share this update and he was working with therapy. I will complete admission paperwork with pt shortly.   I updated Raynelle Fanning, case Production designer, theatre/television/film and Verdon Cummins, Child psychotherapist as well. Thanks.  Juliann Mule, PT Rehabilitation Admissions Coordinator [phone]
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
## 2015-05-09 NOTE — Progress Notes (Signed)
Pt accepted and approved by worker'comp for admission to [facility].  Plan dc to CIR later today.    Quintella Baton, RN, BSN  Trauma/Neuro ICU Case Manager [phone]

## 2015-05-09 NOTE — Progress Notes (Signed)
Patient discharged to Rehab.
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
## 2015-05-09 NOTE — Progress Notes (Signed)
     Subjective:  S/P closed reduction of L shoulder on 05/05/2015 due to dislocation and bilateral pelvic rami fractures.Patient reports pain as moderate.  Sleeping comfortably in bed this morning.  Was able to have a BM this morning, so is feeling much better. Was able to tolerated sitting in a chair for a few hours yesterday.  Case worker is coordinating arrangements to go to inpatient rehab if possible.   Objective:   VITALS:   Filed Vitals:   05/07/15 2241 05/08/15 0545 05/08/15 2126 05/09/15 0542  BP: 116/63 114/65 120/63 124/65  Pulse: 99 78 79 80  Temp: 98.9 F (37.2 C) 98.7 F (37.1 C) 99.1 F (37.3 C) 98.6 F (37 C)  TempSrc: Oral Oral Oral Oral  Resp: 18 20 17 17   Height:      Weight:      SpO2: 93% 92% 94% 95%    Neurologically intact ABD soft Neurovascular intact Sensation intact distally Intact pulses distally Dorsiflexion/Plantar flexion intact L arm in sling  Lab Results  Component Value Date   WBC 13.2* 05/09/2015   HGB 8.7* 05/09/2015   HCT 24.9* 05/09/2015   MCV 97.3 05/09/2015   PLT 164 05/09/2015   BMET    Component Value Date/Time   NA 130* 05/09/2015 0336   NA 138 07/23/2012 2139   K 4.1 05/09/2015 0336   K 3.9 07/23/2012 2139   CL 100* 05/09/2015 0336   CL 106 07/23/2012 2139   CO2 23 05/09/2015 0336   CO2 25 07/23/2012 2139   GLUCOSE 127* 05/09/2015 0336   GLUCOSE 108* 07/23/2012 2139   BUN 12 05/09/2015 0336   BUN 13 07/23/2012 2139   CREATININE 0.83 05/09/2015 0336   CREATININE 1.01 07/23/2012 2139   CALCIUM 7.8* 05/09/2015 0336   CALCIUM 9.1 07/23/2012 2139   GFRNONAA >60 05/09/2015 0336   GFRNONAA >60 07/23/2012 2139   GFRAA >60 05/09/2015 0336   GFRAA >60 07/23/2012 2139     Assessment/Plan:     Active Problems:   Shoulder dislocation   AKI (acute kidney injury)   Crush injury   Closed bilateral fracture of pubic rami   Up with therapy WBAT in the BLE, NWB in the LUE with sling at all times Encouraged placing  a pillow under left arm to try to help with swelling in the hand DVT prophylaxis per trauma OK from ortho standpoint to transfer to rehab once cleared by trauma team and arrangements are in place   Catriona Dillenbeck Marie 05/09/2015, 7:52 AM Cell [phone]

## 2015-05-10 ENCOUNTER — Inpatient Hospital Stay (HOSPITAL_COMMUNITY): Payer: Worker's Compensation | Admitting: Occupational Therapy

## 2015-05-10 ENCOUNTER — Inpatient Hospital Stay (HOSPITAL_COMMUNITY): Payer: Worker's Compensation | Admitting: Physical Therapy

## 2015-05-10 ENCOUNTER — Encounter (HOSPITAL_COMMUNITY): Payer: Self-pay | Admitting: *Deleted

## 2015-05-10 ENCOUNTER — Inpatient Hospital Stay (HOSPITAL_COMMUNITY): Payer: Worker's Compensation

## 2015-05-10 DIAGNOSIS — R609 Edema, unspecified: Secondary | ICD-10-CM

## 2015-05-10 DIAGNOSIS — M79609 Pain in unspecified limb: Secondary | ICD-10-CM

## 2015-05-10 DIAGNOSIS — S329XXG Fracture of unspecified parts of lumbosacral spine and pelvis, subsequent encounter for fracture with delayed healing: Secondary | ICD-10-CM

## 2015-05-10 LAB — CBC WITH DIFFERENTIAL/PLATELET
BASOS PCT: 0 % (ref 0–1)
Basophils Absolute: 0 10*3/uL (ref 0.0–0.1)
Eosinophils Absolute: 0.3 10*3/uL (ref 0.0–0.7)
Eosinophils Relative: 3 % (ref 0–5)
HEMATOCRIT: 24 % — AB (ref 39.0–52.0)
HEMOGLOBIN: 8.4 g/dL — AB (ref 13.0–17.0)
Lymphocytes Relative: 17 % (ref 12–46)
Lymphs Abs: 2 10*3/uL (ref 0.7–4.0)
MCH: 33.6 pg (ref 26.0–34.0)
MCHC: 35 g/dL (ref 30.0–36.0)
MCV: 96 fL (ref 78.0–100.0)
MONO ABS: 1.3 10*3/uL — AB (ref 0.1–1.0)
MONOS PCT: 11 % (ref 3–12)
Neutro Abs: 7.6 10*3/uL (ref 1.7–7.7)
Neutrophils Relative %: 69 % (ref 43–77)
Platelets: 192 10*3/uL (ref 150–400)
RBC: 2.5 MIL/uL — ABNORMAL LOW (ref 4.22–5.81)
RDW: 13.1 % (ref 11.5–15.5)
WBC: 11.2 10*3/uL — ABNORMAL HIGH (ref 4.0–10.5)

## 2015-05-10 LAB — COMPREHENSIVE METABOLIC PANEL
ALBUMIN: 1.9 g/dL — AB (ref 3.5–5.0)
ALK PHOS: 56 U/L (ref 38–126)
ALT: 41 U/L (ref 17–63)
AST: 50 U/L — ABNORMAL HIGH (ref 15–41)
Anion gap: 7 (ref 5–15)
BUN: 13 mg/dL (ref 6–20)
CALCIUM: 7.8 mg/dL — AB (ref 8.9–10.3)
CO2: 28 mmol/L (ref 22–32)
Chloride: 95 mmol/L — ABNORMAL LOW (ref 101–111)
Creatinine, Ser: 0.74 mg/dL (ref 0.61–1.24)
GFR calc non Af Amer: >60 mL/min (ref >60)
Glucose, Bld: 115 mg/dL — ABNORMAL HIGH (ref 65–99)
POTASSIUM: 3.6 mmol/L (ref 3.5–5.1)
Sodium: 130 mmol/L — ABNORMAL LOW (ref 135–145)
TOTAL PROTEIN: 4.8 g/dL — AB (ref 6.5–8.1)
Total Bilirubin: 1.3 mg/dL — ABNORMAL HIGH (ref 0.3–1.2)

## 2015-05-10 MED ORDER — MORPHINE SULFATE 2 MG/ML IJ SOLN
5.0000 mg | Freq: Every morning | INTRAMUSCULAR | Status: DC
  Administered 2015-05-10 – 2015-05-20 (×12): 5 mg via INTRAVENOUS
  Filled 2015-05-10 (×15): qty 3
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
## 2015-05-10 NOTE — Progress Notes (Signed)
Physical Therapy Session Note  Patient Details  Name: ERVINE PEMBLETON MRN: [id number] Date of Birth: [birth year]  Today's Date: 05/10/2015 PT Individual Time: 1300-1330 PT Individual Time Calculation (min): 30 min   Short Term Goals: Week 1:  PT Short Term Goal 1 (Week 1): Pt will increase supine to edge of bed, edge of bed to supine with siderail to min A.  PT Short Term Goal 2 (Week 1): Pt will increase transfers bed to chair, chair to bed with LRAD to min A.  PT Short Term Goal 3 (Week 1): Pt will ambulate with LRAD about 25 feet with min A.  PT Short Term Goal 4 (Week 1): Pt will ascend/descend 2 stairs with 1 rail and min A.  PT Short Term Goal 5 (Week 1): Pt will propel w/c about 50 feet with S.   Skilled Therapeutic Interventions/Progress Updates:  Pt was seen bedside in the pm. Pt transferred supine to edge of bed with side rail, head of bed elevated and max A with verbal cues. Pt tolerated edge of bed about 15 minutes with close supervision and verbal cues. Pt transferred sit to stand x 1 with max A and verbal cues, tolerating standing about 15 seconds. Pt requesting to return to supine secondary to pain and increased tightness/stiffness in back. Pt transferred edge of bed to supine with +2 total A secondary to pain. Pt positioned for comfort and left with call bell within reach.   Therapy Documentation Precautions:  Precautions Precautions: Fall Required Braces or Orthoses: Sling (on at all times) Restrictions Weight Bearing Restrictions: Yes LUE Weight Bearing: Non weight bearing RLE Weight Bearing: Weight bearing as tolerated LLE Weight Bearing: Weight bearing as tolerated General:  Pain: Pt c/o 7/10 pain low back and pelvis.    Other Treatments:    See FIM for current functional status  Therapy/Group: Individual Therapy  [name] 05/10/2015, 4:04 PM

## 2015-05-10 NOTE — Plan of Care (Signed)
Problem: RH BLADDER ELIMINATION Goal: RH STG MANAGE BLADDER WITH ASSISTANCE STG Manage Bladder With mod Assistance  Outcome: Not Progressing I and O cath done

## 2015-05-10 NOTE — Progress Notes (Signed)
*  PRELIMINARY RESULTS* Vascular Ultrasound Lower extremity venous duplex has been completed.  Preliminary findings: negative for DVT.  Farrel Demark, RDMS, RVT  05/10/2015, 9:27 AM

## 2015-05-10 NOTE — Progress Notes (Signed)
Orthopedic Tech Progress Note Patient Details:  Steve [name] 1981/06/03 [id number]  Patient ID: [name], male   DOB: 03/06/1981, 34 y.o.   MRN: [id number]   [name] 05/10/2015, 5:49 PMApplied trapeze bar
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
## 2015-05-11 ENCOUNTER — Inpatient Hospital Stay (HOSPITAL_COMMUNITY): Payer: Worker's Compensation | Admitting: Physical Therapy

## 2015-05-11 MED ORDER — WHITE PETROLATUM GEL
Status: AC
  Administered 2015-05-11: 12:00:00
  Filled 2015-05-11: qty 1

## 2015-05-11 NOTE — Progress Notes (Signed)
Physical Therapy Session Note  Patient Details  Name: Steve [name] MRN: [id number] Date of Birth: [birth year]  Today's Date: 05/11/2015 PT Individual Time: 1500-1545 PT Individual Time Calculation (min): 45 min   Short Term Goals: Week 1:  PT Short Term Goal 1 (Week 1): Pt will increase supine to edge of bed, edge of bed to supine with siderail to min A.  PT Short Term Goal 2 (Week 1): Pt will increase transfers bed to chair, chair to bed with LRAD to min A.  PT Short Term Goal 3 (Week 1): Pt will ambulate with LRAD about 25 feet with min A.  PT Short Term Goal 4 (Week 1): Pt will ascend/descend 2 stairs with 1 rail and min A.  PT Short Term Goal 5 (Week 1): Pt will propel w/c about 50 feet with S.   Skilled Therapeutic Interventions/Progress Updates:  Pt was seen bedside in the pm. Pt transferred supine to edge of bed with side rail, head of bed elevated and max A with verbal cues. Pt tolerated edge of bed about 20 minutes with S to min guard. Pt able to partially stand with min A +2 with commode armrest to support R UE. Following rest break, pt transferred sit to stand with hemi walker and min A +2, pt tolerated standing about 15 seconds. Pt c/o increased dizziness. Pt returned to supine with +2 total A. Pt able to moved up in bed with bed rails and min A +2. Pt's dizziness subsided once supine in bed. Pt positioned comfortable in bed and left with call bell within reach.   Therapy Documentation Precautions:  Precautions Precautions: Fall Required Braces or Orthoses: Sling Restrictions Weight Bearing Restrictions: Yes LUE Weight Bearing: Non weight bearing RLE Weight Bearing: Weight bearing as tolerated LLE Weight Bearing: Weight bearing as tolerated General:   Pain: Pt c/o mod pelvis pain into extremities R worse than L, increases with activity.    Locomotion :     See FIM for current functional status  Therapy/Group: Individual Therapy  [name] 05/11/2015, 4:08  PM
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
## 2015-05-11 NOTE — Plan of Care (Signed)
Problem: RH BLADDER ELIMINATION Goal: RH STG MANAGE BLADDER WITH ASSISTANCE STG Manage Bladder With mod Assistance  Outcome: Not Progressing In and out cath every 8 hours

## 2015-05-12 ENCOUNTER — Inpatient Hospital Stay (HOSPITAL_COMMUNITY): Payer: Worker's Compensation

## 2015-05-12 ENCOUNTER — Inpatient Hospital Stay (HOSPITAL_COMMUNITY): Payer: Worker's Compensation | Admitting: Occupational Therapy

## 2015-05-12 ENCOUNTER — Inpatient Hospital Stay (HOSPITAL_COMMUNITY): Payer: Self-pay

## 2015-05-12 DIAGNOSIS — S329XXS Fracture of unspecified parts of lumbosacral spine and pelvis, sequela: Secondary | ICD-10-CM

## 2015-05-12 DIAGNOSIS — D62 Acute posthemorrhagic anemia: Secondary | ICD-10-CM

## 2015-05-12 DIAGNOSIS — S43005S Unspecified dislocation of left shoulder joint, sequela: Secondary | ICD-10-CM

## 2015-05-12 LAB — BASIC METABOLIC PANEL
Anion gap: 8 (ref 5–15)
BUN: 10 mg/dL (ref 6–20)
CHLORIDE: 94 mmol/L — AB (ref 101–111)
CO2: 30 mmol/L (ref 22–32)
Calcium: 7.9 mg/dL — ABNORMAL LOW (ref 8.9–10.3)
Creatinine, Ser: 0.78 mg/dL (ref 0.61–1.24)
GFR calc Af Amer: >60 mL/min (ref >60)
GFR calc non Af Amer: >60 mL/min (ref >60)
GLUCOSE: 112 mg/dL — AB (ref 65–99)
POTASSIUM: 3.5 mmol/L (ref 3.5–5.1)
Sodium: 132 mmol/L — ABNORMAL LOW (ref 135–145)

## 2015-05-12 LAB — CBC
HCT: 25.8 % — ABNORMAL LOW (ref 39.0–52.0)
Hemoglobin: 8.8 g/dL — ABNORMAL LOW (ref 13.0–17.0)
MCH: 34 pg (ref 26.0–34.0)
MCHC: 34.1 g/dL (ref 30.0–36.0)
MCV: 99.6 fL (ref 78.0–100.0)
Platelets: 244 10*3/uL (ref 150–400)
RBC: 2.59 MIL/uL — ABNORMAL LOW (ref 4.22–5.81)
RDW: 13.8 % (ref 11.5–15.5)
WBC: 11 10*3/uL — ABNORMAL HIGH (ref 4.0–10.5)

## 2015-05-12 MED ORDER — POLYETHYLENE GLYCOL 3350 17 G PO PACK
17.0000 g | PACK | Freq: Every day | ORAL | Status: DC
  Administered 2015-05-13: 17 g via ORAL
  Filled 2015-05-12 (×3): qty 1

## 2015-05-12 MED ORDER — POTASSIUM CHLORIDE CRYS ER 20 MEQ PO TBCR
20.0000 meq | EXTENDED_RELEASE_TABLET | Freq: Two times a day (BID) | ORAL | Status: AC
Start: 2015-05-12 — End: 2015-05-13
  Administered 2015-05-12 – 2015-05-13 (×4): 20 meq via ORAL
  Filled 2015-05-12 (×5): qty 1

## 2015-05-12 MED ORDER — OXYCODONE HCL ER 10 MG PO T12A: 40.0000 mg | EXTENDED_RELEASE_TABLET | Freq: Two times a day (BID) | ORAL | Status: DC

## 2015-05-12 MED ORDER — OXYCODONE HCL ER 10 MG PO T12A
40.0000 mg | EXTENDED_RELEASE_TABLET | Freq: Two times a day (BID) | ORAL | Status: DC
  Administered 2015-05-12 – 2015-05-14 (×5): 40 mg via ORAL
  Filled 2015-05-12 (×5): qty 4
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
## 2015-05-12 NOTE — Progress Notes (Signed)
Physical Therapy Session Note  Patient Details  Name: Steve [name] MRN: [id number] Date of Birth: 06/06/1981  Today's Date: 05/12/2015 PT Individual Time: 1105-1200 PT Individual Time Calculation (min): 55 min   Short Term Goals: Week 1:  PT Short Term Goal 1 (Week 1): Pt will increase supine to edge of bed, edge of bed to supine with siderail to min A.  PT Short Term Goal 2 (Week 1): Pt will increase transfers bed to chair, chair to bed with LRAD to min A.  PT Short Term Goal 3 (Week 1): Pt will ambulate with LRAD about 25 feet with min A.  PT Short Term Goal 4 (Week 1): Pt will ascend/descend 2 stairs with 1 rail and min A.  PT Short Term Goal 5 (Week 1): Pt will propel w/c about 50 feet with S.   Skilled Therapeutic Interventions/Progress Updates:    Patient seen at bedside with family presents.  Performed minimal rolling mod/max assist for donning brief and shorts.  Patient supine to sit +2 total assist (one assist with legs, one assist with trunk).  Patient seated edge of bed for about 35 minutes.  Performed sit to stand x 1 pulling up on BSC in front of pt +2 max assist with elevated bed and then transition to arm around PT tech for standing lateral weight shifts x about 1 minute standing.  Sat due to pt c/o fatigue.  Then work on pelvic ant/post tilts seated x 5 each, then lateral tilts and reciprocal scoots back onto bed with mod assist.  Patient sit to supine with max assist +2 (pt assist with scooting initially, then with supporting trunk with left elbow and scooting over in bed pulling on OHT.  Then scooted up in bed with assist to stabilize feet in hooklying position with bed in trendelenberg using OHT, then head board to pull up.  Patient positioned for comfort, elevated scrotum and ice applied.  Left with family in room and call bell in reach.  Therapy Documentation Precautions:  Precautions Precautions: Fall Required Braces or Orthoses: Sling Restrictions Weight Bearing  Restrictions: Yes LUE Weight Bearing: Non weight bearing RLE Weight Bearing: Weight bearing as tolerated LLE Weight Bearing: Weight bearing as tolerated Pain: Pain Assessment Pain Assessment: 0-10 Pain Score: 10-Worst pain ever Faces Pain Scale: Hurts whole lot Pain Type: Acute pain Pain Location: Back Pain Orientation: Right;Lower Pain Descriptors / Indicators: Aching Pain Frequency: Constant Pain Onset: With Activity Patients Stated Pain Goal: 4 Pain Intervention(s): Rest;Elevated extremity;Other (Comment) (RN gave medication prior to therapy)  See FIM for current functional status  Therapy/Group: Individual Therapy  Rudi Coco [place], [place] [phone] 05/12/2015  05/12/2015, 4:10 PM

## 2015-05-12 NOTE — Progress Notes (Signed)
Occupational Therapy Session Note  Patient Details  Name: Steve [name] MRN: [id number] Date of Birth: 1981/07/28  Today's Date: 05/12/2015 OT Individual Time: 1300-1415 OT Individual Time Calculation (min): 75 min  70 minutes charged self-care; 5 minutes spent on [facility]  Short Term Goals: Week 1:  OT Short Term Goal 1 (Week 1): Pt. will perform supine to EOB with min assist to prepare for ADL OT Short Term Goal 2 (Week 1): Pt. will bathe UB/LB with min assist and AE OT Short Term Goal 3 (Week 1): Pt will dress UB /LB with min assist OT Short Term Goal 4 (Week 1): Pt will transfer to [facility] dropped arm with mod assist  Skilled Therapeutic Interventions/Progress Updates:  Patient received supine in bed with no pain "I feel fine". Administered leg lifter and patient engaged in bed mobility, using left lifter for LLE management. Patient engaged in bed mobility with max assist +2. Patient then sat EOB with increased pain = 9/10 in bilateral hips. Patient stood with bariatric STEDY and max assist +2 (+3 with extra person holding STEDY for safety). Transferred patient > BSC while on STEDY. Patient grimacing and moaning in pain during transfer. Patient sat on BSC ~ 5 minutes (uncharged time). Patient then stood using STEDY with +2 and transferred back to EOB>supine. Positioned patient in supine position with all needs within reach and wife present in room.   Discussed need for OOB schedule with scheduling team and no nursing transfers or maxi move transfers recommended at this time.   Therapy Documentation Precautions:  Precautions Precautions: Fall Required Braces or Orthoses: Sling Restrictions Weight Bearing Restrictions: Yes LUE Weight Bearing: Non weight bearing RLE Weight Bearing: Weight bearing as tolerated LLE Weight Bearing: Weight bearing as tolerated  See FIM for current functional status  Therapy/Group: Individual Therapy  Breccan Galant , MS, OTR/L, CLT Pager: [phone]   05/12/2015, 3:34 PM
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
## 2015-05-12 NOTE — Plan of Care (Signed)
Problem: RH PAIN MANAGEMENT Goal: RH STG PAIN MANAGED AT OR BELOW PT'S PAIN GOAL <3  Outcome: Not Progressing Reports pain as 10     

## 2015-05-12 NOTE — Progress Notes (Signed)
Orthopedic Tech Progress Note Patient Details:  Steve [name] 1981/03/21 [id number]  Ortho Devices Type of Ortho Device: Arm sling Ortho Device/Splint Location: lue Ortho Device/Splint Interventions: Application   Nikki Dom 05/12/2015, 12:35 PM

## 2015-05-12 NOTE — Progress Notes (Signed)
Patient information reviewed and entered into eRehab system by Leyland Kenna, RN, CRRN, PPS Coordinator.  Information including medical coding and functional independence measure will be reviewed and updated through discharge.    

## 2015-05-12 NOTE — Progress Notes (Signed)
Occupational Therapy Session Note  Patient Details  Name: Steve [name] MRN: [id number] Date of Birth: May 20, 1981  Today's Date: 05/12/2015 OT Individual Time: 1000-1100 OT Individual Time Calculation (min): 60 min    Short Term Goals: Week 1:  OT Short Term Goal 1 (Week 1): Pt. will perform supine to EOB with min assist to prepare for ADL OT Short Term Goal 2 (Week 1): Pt. will bathe UB/LB with min assist and AE OT Short Term Goal 3 (Week 1): Pt will dress UB /LB with min assist OT Short Term Goal 4 (Week 1): Pt will transfer to [facility] dropped arm with mod assist  Skilled Therapeutic Interventions/Progress Updates:    Pt engaged in bed mobility and sitting balance tasks.  Pt required tot A + 2 for supine<>sit EOB in preparation for UB bathing tasks.  Pt required min A for sitting balance while washing face.  RN changed dressing on LUE/shoulder/back while patient participated in sitting balance tasks with/without RUE support.  Pt c/o continuous pain in back and pelvic region while sitting but continued to participate in therapy.  Pt returned to supine at end of session with wife present.    Therapy Documentation Precautions:  Precautions Precautions: Fall Required Braces or Orthoses: Sling Restrictions Weight Bearing Restrictions: Yes LUE Weight Bearing: Non weight bearing RLE Weight Bearing: Weight bearing as tolerated LLE Weight Bearing: Weight bearing as tolerated  Pain: Pain Assessment Pain Assessment: 0-10 Pain Score: 10-Worst pain ever Pain Type: Acute pain Pain Location: Back Pain Orientation: Right;Left Pain Descriptors / Indicators: Aching Pain Onset: With Activity Pain Intervention(s): RN made aware;Repositioned;Emotional support 2nd Pain Site Pain Score: 8 Pain Type: Acute pain Pain Location: Shoulder Pain Orientation: Left Pain Descriptors / Indicators: Discomfort Pain Frequency: Constant Pain Intervention(s): RN made aware;Repositioned;Emotional support  See  FIM for current functional status  Therapy/Group: Individual Therapy  Rich Brave 05/12/2015, 12:08 PM
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
## 2015-05-13 ENCOUNTER — Inpatient Hospital Stay (HOSPITAL_COMMUNITY): Payer: Worker's Compensation

## 2015-05-13 ENCOUNTER — Inpatient Hospital Stay (HOSPITAL_COMMUNITY): Payer: Worker's Compensation | Admitting: *Deleted

## 2015-05-13 ENCOUNTER — Inpatient Hospital Stay (HOSPITAL_COMMUNITY): Payer: Self-pay

## 2015-05-13 ENCOUNTER — Encounter (HOSPITAL_COMMUNITY): Payer: Self-pay | Admitting: Physician Assistant

## 2015-05-13 ENCOUNTER — Inpatient Hospital Stay (HOSPITAL_COMMUNITY): Payer: Self-pay | Admitting: *Deleted

## 2015-05-13 DIAGNOSIS — D62 Acute posthemorrhagic anemia: Secondary | ICD-10-CM
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
## 2015-05-13 NOTE — Progress Notes (Addendum)
Occupational Therapy Note  Patient Details  Name: Steve [name] MRN: [id number] Date of Birth: 02/26/1981  Today's Date: 05/13/2015 OT Individual Time: 5726-2035 OT Individual Time Calculation (min): 23 min   Pt denied pain while resting in bed Individual Therapy Pt missed 7 mins secondary to fatigue/pain  Pt resting in bed upon arrival with Mom present.  RT joined session to assess.  Discussed OT goals and focus of therapy.  Pt and Mom verbalized understanding of importance of OOB activities. Pt assisted with repositioning in bed .    Lav[facility] Neri [facility] 05/13/2015, 2:56 PM

## 2015-05-13 NOTE — Care Management Note (Signed)
Inpatient Rehabilitation Center Individual Statement of Services  Patient Name:  Steve [name]  Date:  05/12/2015  Welcome to the Inpatient Rehabilitation Center.  Our goal is to provide you with an individualized program based on your diagnosis and situation, designed to meet your specific needs.  With this comprehensive rehabilitation program, you will be expected to participate in at least 3 hours of rehabilitation therapies Monday-Friday, with modified therapy programming on the weekends.  Your rehabilitation program will include the following services:  Physical Therapy (PT), Occupational Therapy (OT), Speech Therapy (ST), 24 hour per day rehabilitation nursing, Therapeutic Recreaction (TR), Neuropsychology, Case Management (Social Worker), Rehabilitation Medicine, Nutrition Services and Pharmacy Services  Weekly team conferences will be held on Tuesdays to discuss your progress.  Your Social Worker will talk with you frequently to get your input and to update you on team discussions.  Team conferences with you and your family in attendance may also be held.  Expected length of stay: 21 days  Overall anticipated outcome: minimal assistance  Depending on your progress and recovery, your program may change. Your Social Worker will coordinate services and will keep you informed of any changes. Your Social Worker's name and contact numbers are listed  below.  The following services may also be recommended but are not provided by the Inpatient Rehabilitation Center:   Driving Evaluations  Home Health Rehabiltiation Services  Outpatient Rehabilitation Services  Vocational Rehabilitation   Arrangements will be made to provide these services after discharge if needed.  Arrangements include referral to agencies that provide these services.  Your insurance has been verified to be:  Worker's Compensation Your primary doctor is:  None  Pertinent information will be shared with your doctor and  your insurance company.  Social Worker:  [place], [place] [phone] or ([phone]   Information discussed with and copy given to patient by: Steve [name], 05/12/2015, 6:01 PM

## 2015-05-13 NOTE — Progress Notes (Signed)
Physical Therapy Session Note  Patient Details  Name: Steve [name] MRN: [id number] Date of Birth: 1981-02-14  Today's Date: 05/13/2015 PT Individual Time:1005-1100; and 1515-1600 PT Individual Time Calculation (min): 45 min   Short Term Goals: Week 1:  PT Short Term Goal 1 (Week 1): Pt will increase supine to edge of bed, edge of bed to supine with siderail to min A.  PT Short Term Goal 2 (Week 1): Pt will increase transfers bed to chair, chair to bed with LRAD to min A.  PT Short Term Goal 3 (Week 1): Pt will ambulate with LRAD about 25 feet with min A.  PT Short Term Goal 4 (Week 1): Pt will ascend/descend 2 stairs with 1 rail and min A.  PT Short Term Goal 5 (Week 1): Pt will propel w/c about 50 feet with S.   Skilled Therapeutic Interventions/Progress Updates:    Session 1: Patient up in recliner.  Assisted to [facility] with +2 assist using bariatric Stedy assist under hips with pt pulling up with right arm.  Assisted back to recliner after toileting, pt stood during hygiene and doff/don shorts with supervision with one UE support total of about 45 seconds.  C/o pain throughout.  Seated in recliner AAROM heel slides x 10 each leg, AROM ankle pumps x 20, knee presses w/ 5 sec hold x 5 each leg.  Adjusted hips in recliner and propped with pillows for comfort left up with call bell and other needs within reach.   Session 2: Spent time adjusting wheelchair for patient.  In supine pt performed half bridge on R then L x 5 each with assist, SAQ x 5 each leg.  Assisted supine to sit max +2 assist pt assist with scooting and initiates lifting trunk with OHT.  Patient sit to stand with Stedy +2 max assist from bed, pivot with stedy to [facility].  Back to supine after toileting max assist for LE's and guiding shoulders down, but pt assisted more with upper body to supine.  Adjusted with pillows for comfort with call bell in reach.  Therapy Documentation Precautions:  Precautions Precautions: Fall Required  Braces or Orthoses: Sling Restrictions Weight Bearing Restrictions: Yes LUE Weight Bearing: Non weight bearing RLE Weight Bearing: Weight bearing as tolerated LLE Weight Bearing: Weight bearing as tolerated  Pain: Pain Assessment Pain Score: 9  Faces Pain Scale: Hurts whole lot Pain Type: Acute pain Pain Location: Sacrum Pain Orientation: Right Pain Descriptors / Indicators: Aching;Numbness Pain Onset: With Activity Pain Intervention(s): Elevated extremity;Repositioned    See FIM for current functional status  Therapy/Group: Individual Therapy  Rudi Coco [place], [place] [phone] 05/13/2015  05/13/2015, 4:51 PM
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
## 2015-05-13 NOTE — Progress Notes (Signed)
Occupational Therapy Session Note  Patient Details  Name: Steve [name] MRN: [id number] Date of Birth: [birth year]  Today's Date: 05/13/2015 OT Individual Time: 0900-1000 OT Individual Time Calculation (min): 60 min    Short Term Goals: Week 1:  OT Short Term Goal 1 (Week 1): Pt. will perform supine to EOB with min assist to prepare for ADL OT Short Term Goal 2 (Week 1): Pt. will bathe UB/LB with min assist and AE OT Short Term Goal 3 (Week 1): Pt will dress UB /LB with min assist OT Short Term Goal 4 (Week 1): Pt will transfer to [facility] dropped arm with mod assist  Skilled Therapeutic Interventions/Progress Updates:    Pt resting in bed upon arrival and requested use of BSC.  Pt required tot A + 2 with patient using trapeze bar to assist with performing supine->sit EOB in preparation for transfer to [facility] with Stedy.  Pt was able to sit EOB with supervision with RUE support initially on bed rail and then on crossbar of Stedy.  Pt required min A for sit<>stand from elevated EOB while pulling up with RUE on Stedy.  Pt sat on [facility] with supervision and RUE support on side rail.  Pt required tot A for toileting tasks. Pt transferred to recliner with Stedy and positioned for increased comfort.  Pt c/o continual pain through sacrum when sitting but stated he was able to tolerate.  Pt remained in recliner awaiting PT session.  Focus on bed mobility, sitting balance, pain management, and safety awareness.  Therapy Documentation Precautions:  Precautions Precautions: Fall Required Braces or Orthoses: Sling Restrictions Weight Bearing Restrictions: Yes LUE Weight Bearing: Non weight bearing RLE Weight Bearing: Weight bearing as tolerated LLE Weight Bearing: Weight bearing as tolerated   Pain: Pain Assessment Faces Pain Scale: Hurts whole lot Pain Type: Acute pain Pain Location: Sacrum Pain Orientation: Right;Left Pain Descriptors / Indicators: Discomfort;Aching;Pressure Pain Onset: With  Activity Pain Intervention(s): RN made aware;Repositioned  See FIM for current functional status  Therapy/Group: Individual Therapy  Rich Brave 05/13/2015, 12:19 PM
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
## 2015-05-13 NOTE — Progress Notes (Signed)
Patient ID: Steve [name], male   DOB: 08-Dec-1980, 34 y.o.   MRN: [id number] F/U on burns LUE and side. L arm burns with patchy eschar and seem sensate but there is a larger eschar area near the elbow that is not sensate and I feel has progressed to full thickness. I have asked Dr. Kelly Splinter from Plastic Surgery to consult. Violeta Gelinas, MD, MPH, FACS Trauma: [phone] General Surgery: [phone]

## 2015-05-13 NOTE — Plan of Care (Signed)
Problem: RH BLADDER ELIMINATION Goal: RH OTHER STG BLADDER ELIMINATION GOALS W/ASSIST Other STG Bladder Elimination Goals With min Assistance  Outcome: Not Progressing In and Out cath q4-6hrs  Problem: RH PAIN MANAGEMENT Goal: RH STG PAIN MANAGED AT OR BELOW PT'S PAIN GOAL <3  Outcome: Not Progressing Rates pain 9/10
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
## 2015-05-14 ENCOUNTER — Inpatient Hospital Stay (HOSPITAL_COMMUNITY): Payer: Self-pay

## 2015-05-14 ENCOUNTER — Inpatient Hospital Stay (HOSPITAL_COMMUNITY): Payer: Worker's Compensation

## 2015-05-14 ENCOUNTER — Inpatient Hospital Stay (HOSPITAL_COMMUNITY): Payer: Worker's Compensation | Admitting: Physical Therapy

## 2015-05-14 DIAGNOSIS — T300 Burn of unspecified body region, unspecified degree: Secondary | ICD-10-CM

## 2015-05-14 MED ORDER — MORPHINE SULFATE 2 MG/ML IJ SOLN
5.0000 mg | Freq: Once | INTRAMUSCULAR | Status: AC
  Administered 2015-05-14: 5 mg via INTRAVENOUS
  Filled 2015-05-14: qty 3

## 2015-05-14 MED ORDER — FAMOTIDINE 20 MG PO TABS
20.0000 mg | ORAL_TABLET | Freq: Two times a day (BID) | ORAL | Status: DC
  Administered 2015-05-14 – 2015-05-30 (×32): 20 mg via ORAL
  Filled 2015-05-14 (×38): qty 1

## 2015-05-14 MED ORDER — OMEPRAZOLE 20 MG PO CPDR
20.0000 mg | DELAYED_RELEASE_CAPSULE | Freq: Every day | ORAL | Status: DC
  Filled 2015-05-14 (×2): qty 1

## 2015-05-14 MED ORDER — LIDOCAINE HCL 2 % EX GEL
1.0000 "application " | CUTANEOUS | Status: DC | PRN
  Administered 2015-05-15 – 2015-05-17 (×4): 1 via URETHRAL
  Filled 2015-05-14 (×6): qty 5

## 2015-05-14 MED ORDER — NON FORMULARY: 20.0000 mg | Freq: Every day | Status: DC

## 2015-05-14 MED ORDER — OXYCODONE HCL ER 10 MG PO T12A
60.0000 mg | EXTENDED_RELEASE_TABLET | Freq: Two times a day (BID) | ORAL | Status: DC
  Administered 2015-05-14 – 2015-05-30 (×31): 60 mg via ORAL
  Filled 2015-05-14 (×31): qty 6

## 2015-05-14 MED ORDER — TRAZODONE HCL 50 MG PO TABS
100.0000 mg | ORAL_TABLET | Freq: Every day | ORAL | Status: DC
  Administered 2015-05-14 – 2015-05-23 (×10): 100 mg via ORAL
  Filled 2015-05-14 (×11): qty 2

## 2015-05-14 MED ORDER — OXYCODONE HCL ER 10 MG PO T12A
20.0000 mg | EXTENDED_RELEASE_TABLET | Freq: Once | ORAL | Status: AC
  Administered 2015-05-14: 20 mg via ORAL
  Filled 2015-05-14: qty 2

## 2015-05-14 MED ORDER — SIMETHICONE 40 MG/0.6ML PO SUSP
160.0000 mg | Freq: Four times a day (QID) | ORAL | Status: DC
  Administered 2015-05-14 – 2015-05-19 (×20): 160 mg via ORAL
  Filled 2015-05-14 (×25): qty 2.4

## 2015-05-14 MED ORDER — OMEPRAZOLE 20 MG PO CPDR
20.0000 mg | DELAYED_RELEASE_CAPSULE | Freq: Every day | ORAL | Status: DC
  Administered 2015-05-15 – 2015-05-30 (×15): 20 mg via ORAL
  Filled 2015-05-14 (×18): qty 1

## 2015-05-14 NOTE — Progress Notes (Signed)
Social Work Patient ID: Steve [name], male   DOB: 05/01/1981, 34 y.o.   MRN: [id number]   Met with pt's wife and pt this afternoon to review team conference.  Both aware and agreeable with targeted d/c date of 7/8 with minimal assistance goals.  Both report each day he "does a little better..."  Aware that plastic planning debridement next week.  Have also spoken with pt's WC CM who is aware of plans and target d/c date.  Continue to follow.  Steve Newmann, LCSW
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
## 2015-05-14 NOTE — Progress Notes (Signed)
Occupational Therapy Note  Patient Details  Name: Steve [name] MRN: [id number] Date of Birth: 10/16/1981  Today's Date: 05/14/2015 OT Individual Time: 1300-1400 OT Individual Time Calculation (min): 60 min  and Today's Date: 05/14/2015 OT Missed Time: 30 Minutes Missed Time Reason: Pain;Patient fatigue  Pt c/o 10/10 pain in pelvis with activity/movement; RN aware and repositioned Individual Treatment  Pt resting in tilt w/c upon arrival.  RN entered to inform patient that she needed to change his wound dressing and insert foley catheter before going down for shoulder xrays.  Pt attempted to perform sit->stand from w/c with no success.  Pt used Stedy to perform sit->stand with mod A for task initiation from w/c.  Pt required max A for bed mobility and repositioning in bed in preparation for nursing care.  Focus on activity tolerance, bed mobility, sit<>stand, sitting balance, and functional transfers.   Lavone Neri [facility] 05/14/2015, 2:10 PM

## 2015-05-14 NOTE — Progress Notes (Signed)
Occupational Therapy Session Note  Patient Details  Name: Steve [name] MRN: [id number] Date of Birth: 1981/10/27  Today's Date: 05/14/2015 OT Individual Time: 0930-1015 OT Individual Time Calculation (min): 45 min  and Today's Date: 05/14/2015 OT Missed Time: 15 Minutes Missed Time Reason: Pain;Patient fatigue   Short Term Goals: Week 1:  OT Short Term Goal 1 (Week 1): Pt. will perform supine to EOB with min assist to prepare for ADL OT Short Term Goal 2 (Week 1): Pt. will bathe UB/LB with min assist and AE OT Short Term Goal 3 (Week 1): Pt will dress UB /LB with min assist OT Short Term Goal 4 (Week 1): Pt will transfer to [facility] dropped arm with mod assist  Skilled Therapeutic Interventions/Progress Updates:    Pt engaged in bed mobility activities requiring tot A + 2 for rolling right and left.  Pt is able to use RUE to assist with rolling to left and to use trapeze when rolling to right.  Pt continues to have multiple small liquid bowel movements.  Pt also requires multiple rest breaks secondary to increased pain with movement.  Emotional support provided throughout session.   Therapy Documentation Precautions:  Precautions Precautions: Fall Required Braces or Orthoses: Sling Restrictions Weight Bearing Restrictions: Yes LUE Weight Bearing: Non weight bearing RLE Weight Bearing: Weight bearing as tolerated LLE Weight Bearing: Weight bearing as tolerated General: General OT Amount of Missed Time: 15 Minutes  Pain: Pain Assessment Pain Assessment: 0-10 Pain Score: 4  Pain Type: Acute pain Pain Location: Pelvis Pain Orientation: Right;Left Pain Descriptors / Indicators: Sore Pain Onset: On-going Pain Intervention(s): Rest Multiple Pain Sites: No  See FIM for current functional status  Therapy/Group: Individual Therapy  Rich Brave 05/14/2015, 11:26 AM
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
## 2015-05-15 ENCOUNTER — Inpatient Hospital Stay (HOSPITAL_COMMUNITY): Payer: Self-pay | Admitting: Occupational Therapy

## 2015-05-15 ENCOUNTER — Inpatient Hospital Stay (HOSPITAL_COMMUNITY): Payer: Worker's Compensation | Admitting: Physical Therapy

## 2015-05-15 NOTE — Plan of Care (Signed)
Problem: RH BLADDER ELIMINATION Goal: RH STG MANAGE BLADDER WITH ASSISTANCE STG Manage Bladder With mod Assistance  Outcome: Not Progressing Requires catherter for acute retention
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
## 2015-05-15 NOTE — Progress Notes (Signed)
Occupational Therapy Session Note  Patient Details  Name: Steve [name] MRN: [id number] Date of Birth: 1981/05/07  Today's Date: 05/15/2015 OT Individual Time: 1400 (make up time)-1423 OT Individual Time Calculation (min): 23 min    Short Term Goals: Week 1:  OT Short Term Goal 1 (Week 1): Pt. will perform supine to EOB with min assist to prepare for ADL OT Short Term Goal 2 (Week 1): Pt. will bathe UB/LB with min assist and AE OT Short Term Goal 3 (Week 1): Pt will dress UB /LB with min assist OT Short Term Goal 4 (Week 1): Pt will transfer to [facility] dropped arm with mod assist  Skilled Therapeutic Interventions/Progress Updates:  Upon entering the room, pt supine in bed with mother present in room with c/o 7/10 generalized pain as pt states, "hurts all over." Pt utilizing leg lifter in order to move B LE's closer to EOB. Pt able to move R LE off of bed but requires assistance for L LE. Second helper utilized to assist pt with trunk while other assist with B LE to get EOB. RN present in room for dressing change while sitting EOB with steady assist. Pt reporting pain level increasing while in seated position for ~ 5 minutes. Pt returning to bed with total A for assistance with trunk and B LEs. Pt supine in bed with call bell and all needed items within reach. RN notified of pain level.   Therapy Documentation Precautions:  Precautions Precautions: Fall Required Braces or Orthoses: Sling Restrictions Weight Bearing Restrictions: Yes LUE Weight Bearing: Non weight bearing RLE Weight Bearing: Weight bearing as tolerated LLE Weight Bearing: Weight bearing as tolerated Vital Signs: Therapy Vitals Temp: 98.1 F (36.7 C) Temp Source: Oral Pulse Rate: 74 Resp: (!) 22 BP: (!) 118/57 mmHg Patient Position (if appropriate): Lying Oxygen Therapy SpO2: 92 % O2 Device: Not Delivered Pain: Pain Assessment Pain Assessment: 0-10 Faces Pain Scale: Hurts whole lot Pain Type: Acute pain Pain  Location: Pelvis Pain Descriptors / Indicators: Grimacing;Burning;Constant;Discomfort;Moaning;Crying Pain Onset: On-going Pain Intervention(s): Medication (See eMAR)  See FIM for current functional status  Therapy/Group: Individual Therapy  Lowella Grip 05/15/2015, 4:25 PM
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
## 2015-05-15 NOTE — Plan of Care (Signed)
Problem: RH PAIN MANAGEMENT Goal: RH STG PAIN MANAGED AT OR BELOW PT'S PAIN GOAL <3  Outcome: Not Progressing Taking oxy q 4, oxycontin q 12, robaxin, ultram q6and morphine before dressing changes with pain staying at "8"

## 2015-05-16 ENCOUNTER — Inpatient Hospital Stay (HOSPITAL_COMMUNITY): Payer: Worker's Compensation

## 2015-05-16 ENCOUNTER — Inpatient Hospital Stay (HOSPITAL_COMMUNITY): Payer: Self-pay

## 2015-05-16 ENCOUNTER — Inpatient Hospital Stay (HOSPITAL_COMMUNITY): Payer: Self-pay | Admitting: Rehabilitation

## 2015-05-16 DIAGNOSIS — T300 Burn of unspecified body region, unspecified degree: Secondary | ICD-10-CM

## 2015-05-16 NOTE — Plan of Care (Signed)
Problem: RH BOWEL ELIMINATION Goal: RH STG MANAGE BOWEL WITH ASSISTANCE STG Manage Bowel with Assistance. Mod I  Outcome: Not Progressing Required supp for BM and requested daily laxative/stool softener  Problem: RH BLADDER ELIMINATION Goal: RH STG MANAGE BLADDER WITH ASSISTANCE STG Manage Bladder With mod Assistance  Outcome: Not Progressing Foley for urinary retention

## 2015-05-16 NOTE — Progress Notes (Signed)
Physical Therapy Weekly Progress Note  Patient Details  Name: Steve [name] MRN: [id number] Date of Birth: Apr 01, 1981  Beginning of progress report period: May 10, 2015 End of progress report period: May 16, 2015  Today's Date: 05/16/2015 PT Individual Time: 1300-1450 PT Individual Time Calculation (min): 110 min   Patient has met 0 of 5 short term goals.  Pt continues to be severely limited by pain during all aspects of mobility, requiring two person assist and use of stedy for all functional mobility and transfers.  Pt does very well with use of stedy at mod A level and was able to take several steps in // bars duirng today's session.    Patient continues to demonstrate the following deficits: decreased strength and functional use of BLEs, LUE, decreased balance, decreased activity tolerance, pain and therefore will continue to benefit from skilled PT intervention to enhance overall performance with activity tolerance, balance, ability to compensate for deficits and functional use of  right lower extremity and left lower extremity.  Patient progressing toward long term goals..  Continue plan of care.  Goals will likely need to be downgraded by primary therapist during next reporting period.   PT Short Term Goals Week 1:  PT Short Term Goal 1 (Week 1): Pt will increase supine to edge of bed, edge of bed to supine with siderail to min A.  PT Short Term Goal 1 - Progress (Week 1): Progressing toward goal PT Short Term Goal 2 (Week 1): Pt will increase transfers bed to chair, chair to bed with LRAD to min A.  PT Short Term Goal 2 - Progress (Week 1): Progressing toward goal PT Short Term Goal 3 (Week 1): Pt will ambulate with LRAD about 25 feet with min A.  PT Short Term Goal 3 - Progress (Week 1): Progressing toward goal PT Short Term Goal 4 (Week 1): Pt will ascend/descend 2 stairs with 1 rail and min A.  PT Short Term Goal 4 - Progress (Week 1): Not progressing PT Short Term Goal 5  (Week 1): Pt will propel w/c about 50 feet with S.  PT Short Term Goal 5 - Progress (Week 1): Not progressing Week 2:  PT Short Term Goal 1 (Week 2): Pt will perform bed mobility with HOB elevated with bed rails and with use of leg loops at mod A level  PT Short Term Goal 2 (Week 2): Pt will perform functional transfers w/ LRAD at max A of single therapist PT Short Term Goal 3 (Week 2): Pt will tolerate standing x 3 mins in order to increase WB through LEs.  PT Short Term Goal 4 (Week 2): Pt will propel w/c x 50' (one arm drive chair) at min A level  PT Short Term Goal 5 (Week 2): Pt will tolerate sitting up OOB x 2 hours between therapies.    Skilled Therapeutic Interventions/Progress Updates:   Pt received lying in bed, reluctantly agreeable to therapy session.  Performed bed mobility with HOB elevated and with use of rails at leg lifter at +2A level.  Requires assist to lower BLEs to ground and assist at trunk to elevate due to pain in L side of back.  Cues for safety and proper use of leg lifter.  Once at EOB, utilized stedy to transfer to w/c at max A level.  Requires assist to elevate and also to control into w/c due to increased pain.  Assisted to therapy gym in order to utilize // bars for standing and  gait.  Once in // bars pt able to stand with max A of single therapist, however then needing to use restroom, therefore assisted back to restroom and transferred to bedside commode via stedy at max A as above.  Pt successful in voiding however needing +2A for safety and to manage balance, peri care and clothing management.  Assisted back to w/c and back to therapy gym to continue with standing and gait task.  Pt able to stand x 3 reps and perform approx 4' gait x 1 and another 5' x 1 with // bars at max A level with cues for sequencing and assist at each knee to prevent any buckle due to pain.  As pt continued, note improvement in LLE clearance, however still with increased pain while advancing each.   Assisted back to w/c and to room.  Ended session on stedy while RN addressed dressing change.  Pt able to tolerate semi perched position x 10 mins at S to min A level before transferring back to bed to supine at +2A level.  Left in bed with all needs in reach.   Therapy Documentation Precautions:  Precautions Precautions: Fall Required Braces or Orthoses: Sling Restrictions Weight Bearing Restrictions: Yes LUE Weight Bearing: Non weight bearing RLE Weight Bearing: Weight bearing as tolerated LLE Weight Bearing: Weight bearing as tolerated   Vital Signs: Therapy Vitals Temp: 99.1 F (37.3 C) Temp Source: Oral Pulse Rate: 84 Resp: 19 BP: 124/62 mmHg Patient Position (if appropriate): Lying Oxygen Therapy SpO2: 97 % O2 Device: Not Delivered Pain: Pain Assessment Pain Assessment: 0-10 Pain Score: 6  Pain Location: Arm Pain Orientation: Left Pain Descriptors / Indicators: Aching;Sore Pain Onset: On-going Patients Stated Pain Goal: 4 Pain Intervention(s): Medication (See eMAR)     See FIM for current functional status  Therapy/Group: Individual Therapy  Denice Bors 05/16/2015, 6:55 PM
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
## 2015-05-16 NOTE — Progress Notes (Addendum)
Occupational Therapy Weekly Progress Note  Patient Details  Name: Steve [name] MRN: [id number] Date of Birth: 1981-07-20  Beginning of progress report period: May 10, 2015 End of progress report period: May 16, 2015   Patient has met 0 of 5 short term goals.  Pt has demonstrated progress with bed mobility, activity tolerance, sitting balance, functional transfers, and standing balance.  Pt continues to experience significant pain with transitional movements and when sitting upright on EOB or in w/c.  Pt is currently in tilt-in-space w/c in reclined position.  Pt has begun initiating threading BLE into pants and completes LB bathing tasks supine with mod A. Pt requires use of Stedy for transfers from lower surfaces but has performed SPT from elevated bed with tot A + 2 (for safety pt=75%).   Patient continues to demonstrate the following deficits: muscle weakness and acute pain , decreased cardiorespiratoy endurance and decreased sitting balance, decreased standing balance and decreased balance strategiesand therefore will continue to benefit from skilled OT intervention to enhance overall performance with BADL and Reduce care partner burden.  Patient not progressing toward long term goals.  See goal revision..  Continue plan of care.  OT Short Term Goals Week 1:  OT Short Term Goal 1 (Week 1): Pt. will perform supine to EOB with min assist to prepare for ADL OT Short Term Goal 1 - Progress (Week 1): Progressing toward goal OT Short Term Goal 2 (Week 1): Pt. will bathe UB/LB with min assist and AE OT Short Term Goal 2 - Progress (Week 1): Progressing toward goal OT Short Term Goal 3 (Week 1): Pt will dress UB /LB with min assist OT Short Term Goal 3 - Progress (Week 1): Progressing toward goal OT Short Term Goal 4 (Week 1): Pt will transfer to [facility] dropped arm with mod assist OT Short Term Goal 4 - Progress (Week 1): Progressing toward goal OT Short Term Goal 5 - Progress (Week 1):  Progressing toward goal Week 2:  OT Short Term Goal 1 (Week 2): Pt. will perform supine to EOB with min assist to prepare for ADLtaken  OT Short Term Goal 2 (Week 2): Pt will perform bathing with min A supine and sitting EOB OT Short Term Goal 3 (Week 2): Pt will performe LB dressing with mod A OT Short Term Goal 4 (Week 2): Pt will perform SPT to [facility] OT Short Term Goal 5 (Week 2): Pt will perform toileting tasks with mod A        Therapy Documentation Precautions:  Precautions Precautions: Fall Required Braces or Orthoses: Sling Restrictions Weight Bearing Restrictions: Yes LUE Weight Bearing: Non weight bearing RLE Weight Bearing: Weight bearing as tolerated LLE Weight Bearing: Weight bearing as tolerated   See FIM for current functional status  Therapy/Group: Individual Therapy  [name] 05/16/2015, 11:20 AM

## 2015-05-16 NOTE — Plan of Care (Signed)
Problem: RH Simple Meal Prep Goal: LTG Patient will perform simple meal prep w/assist (OT) LTG: Patient will perform simple meal prep with assistance, with/without cues (OT).  Outcome: Not Applicable Date Met:  88/83/58 D/c goal

## 2015-05-16 NOTE — Progress Notes (Signed)
Occupational Therapy Session Note  Patient Details  Name: Steve [name] MRN: [id number] Date of Birth: [birth year]  Today's Date: 05/16/2015 OT Individual Time: 0900-1039 OT Individual Time Calculation (min): 99 min    Short Term Goals: Week 1:  OT Short Term Goal 1 (Week 1): Pt. will perform supine to EOB with min assist to prepare for ADL OT Short Term Goal 2 (Week 1): Pt. will bathe UB/LB with min assist and AE OT Short Term Goal 3 (Week 1): Pt will dress UB /LB with min assist OT Short Term Goal 4 (Week 1): Pt will transfer to [facility] dropped arm with mod assist  Skilled Therapeutic Interventions/Progress Updates:    Pt resting in bed upon arrival and requested to use BSC.  Pt used leg lifter to assist with moving legs to edge of bed.  Upon sitting EOB pt stated that he felt like he was going to have a bowel movement sooner than expected.  Stedy used to transfer to expedite transfer to [facility].  Pt required min A for sit<>stand with Stedy.  Pt required tot A for toileting before transferring to w/c.  Pt completed UB bathing and grooming tasks while seated in w/c.  Continued education about LTG's and demonstrated BLE exercises.  Pt is able to lift BLE without assistance when sitting in w/c.  Pt transferred back to bed and repositioned.  Pt requires tot A + 2 for sit->supine.  Focus on activity tolerance, sitting balance, sit<>stand, discharge planning, and safety awareness.  Therapy Documentation Precautions:  Precautions Precautions: Fall Required Braces or Orthoses: Sling Restrictions Weight Bearing Restrictions: Yes LUE Weight Bearing: Non weight bearing RLE Weight Bearing: Weight bearing as tolerated LLE Weight Bearing: Weight bearing as tolerated  Pain: Pain Assessment Pain Assessment: 0-10 Pain Score: 8  Pain Location: Arm; pelvis Pain Orientation: Left Pain Descriptors / Indicators: Aching Pain Onset: On-going Patients Stated Pain Goal: 6 Pain Intervention(s): RN aware;  premedicated; repositioned; emotional support See FIM for current functional status  Therapy/Group: Individual Therapy  Rich Brave 05/16/2015, 10:51 AM

## 2015-05-17 ENCOUNTER — Inpatient Hospital Stay (HOSPITAL_COMMUNITY): Payer: Worker's Compensation

## 2015-05-17 ENCOUNTER — Inpatient Hospital Stay (HOSPITAL_COMMUNITY): Payer: Worker's Compensation | Admitting: Physical Therapy

## 2015-05-17 NOTE — Progress Notes (Signed)
Pt said that "all of a sudden" he felt extremely itchy inside the penis around foley catheter. Very slight area of light redness at base of meatus. Applied lidocaine and ice pack.  Will discuss with oncoming nurse.

## 2015-05-17 NOTE — Progress Notes (Signed)
Physical Therapy Session Note  Patient Details  Name: GRAYDON DUNMIRE MRN: [id number] Date of Birth: 17-Nov-1981  Today's Date: 05/17/2015 PT Individual Time: 1415-1530 PT Individual Time Calculation (min): 75 min   Short Term Goals: Week 1:  PT Short Term Goal 1 (Week 1): Pt will increase supine to edge of bed, edge of bed to supine with siderail to min A.  PT Short Term Goal 1 - Progress (Week 1): Progressing toward goal PT Short Term Goal 2 (Week 1): Pt will increase transfers bed to chair, chair to bed with LRAD to min A.  PT Short Term Goal 2 - Progress (Week 1): Progressing toward goal PT Short Term Goal 3 (Week 1): Pt will ambulate with LRAD about 25 feet with min A.  PT Short Term Goal 3 - Progress (Week 1): Progressing toward goal PT Short Term Goal 4 (Week 1): Pt will ascend/descend 2 stairs with 1 rail and min A.  PT Short Term Goal 4 - Progress (Week 1): Not progressing PT Short Term Goal 5 (Week 1): Pt will propel w/c about 50 feet with S.  PT Short Term Goal 5 - Progress (Week 1): Not progressing  Skilled Therapeutic Interventions/Progress Updates:  Pt was seen bedside in the pm. Pt transferred supine to edge of bed with side rail and max A. Pt tolerated edge of bed about 5 minutes with S.  Pt transferred edge of bed to w/c with hemiwalker, bed elevated and mod A with verbal cues. Pt tolerated edge of mat about 10 minutes with S. Pt propelled w/c about 25 feet with B LEs and min A with verbal cues. Pt transferred w/c to edge of mat, edge of mat to w/c with hemiwalker, mat elevated and mod A with verbal cues. Pt ambulated in parallel bars x 2 with mod to max A for 5 feet each time with w/c follow. Pt ambulated in hallway with R hand rail, max A and second person follow for safety for 25 feet.   Therapy Documentation Precautions:  Precautions Precautions: Fall Required Braces or Orthoses: Sling Restrictions Weight Bearing Restrictions: Yes LUE Weight Bearing: Non weight  bearing RLE Weight Bearing: Weight bearing as tolerated LLE Weight Bearing: Weight bearing as tolerated General:   Pain: Pt c/o mod to max pelvic pain with mobility.    Locomotion : Ambulation Ambulation/Gait Assistance: 2: Max assist   See FIM for current functional status  Therapy/Group: Individual Therapy  [name] 05/17/2015, 3:59 PM
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
## 2015-05-17 NOTE — Plan of Care (Signed)
Problem: RH BLADDER ELIMINATION Goal: RH STG MANAGE BLADDER WITH ASSISTANCE STG Manage Bladder With mod Assistance  Outcome: Not Progressing Foley catheter

## 2015-05-17 NOTE — Progress Notes (Addendum)
Occupational Therapy Session Note  Patient Details  Name: Steve [name] MRN: [id number] Date of Birth: 1981-01-02  Today's Date: 05/17/2015 OT Individual Time: 0900-1000 OT Individual Time Calculation (min): 60 min   Short Term Goals: Week 2:  OT Short Term Goal 1 (Week 2): Pt. will perform supine to EOB with min assist to prepare for ADLtaken  OT Short Term Goal 2 (Week 2): Pt will perform bathing with min A supine and sitting EOB OT Short Term Goal 3 (Week 2): Pt will performe LB dressing with mod A OT Short Term Goal 4 (Week 2): Pt will perform SPT to [facility] OT Short Term Goal 5 (Week 2): Pt will perform toileting tasks with mod A  Skilled Therapeutic Interventions/Progress Updates: Therapeutic activity with focus on performance of assisted bed mobility using AE, sit<>stand, and improved activity tolerance (out-of-bed).   Pt able to direct staff to assist with transfer to w/c from bed using Stedy device.    With extra time and setup to adjust DME (hospital bed), pt rose from supine to sitting at EOB with +2 assist (pt=60% using right arm, and leg loop to move leg toward EOB).    +2 helper supports legs and therapist assist with lifting upper torso.   Pt able to tolerate and maintain static sitting balance and direct adjustment to bed to manage pain while resting in upright sitting for approx 3 minutes prior to transfer to w/c using Stedy.   Pt rises from EOB with mod assist and bed height raised to approx 25".    Pt reports pain as more tolerable while standing supported in [place] versus sitting in w/c.   Pt c/o of w/c as too low for his sit>stand but accepts pillows to increase height of seat during this session.   Pt lowers to w/c with mod assist (pt=70%), and tolerates reclined sitting for 30 min prior to returning to bed.   During static sitting, pt is interviewed by plastic surgery service briefly.   Pt communicates appropriately and appears to comprehend discussion after review with therapist  after discussion with surgeon.    Pt was then educated on use of out-of-bed schedule to improve activity tolerance and reduce anxiety d/t unknown duration of unsupervised sitting periods.   Pt advised to reference schedule to alert staff to necessity of returning to bed as per schedule, established this date at 1 hour duration, 3 hours per day total time out of bed.   Pt advised to negotiate OOB schedule revisions with primary therapist.   Pt returned to bed using Stedy, with +2 assist, at end of session, with all needs within reach.     Therapy Documentation Precautions:  Precautions Precautions: Fall Required Braces or Orthoses: Sling Restrictions Weight Bearing Restrictions: Yes LUE Weight Bearing: Non weight bearing RLE Weight Bearing: Weight bearing as tolerated LLE Weight Bearing: Weight bearing as tolerated   Pain: 7/10 during upright sitting activity.   [name] repositioned to recline.     See FIM for current functional status  Therapy/Group: Individual Therapy  Jossilyn Benda 05/17/2015, 12:46 PM
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
## 2015-05-18 ENCOUNTER — Inpatient Hospital Stay (HOSPITAL_COMMUNITY): Payer: Worker's Compensation | Admitting: Physical Therapy

## 2015-05-18 LAB — BASIC METABOLIC PANEL
Anion gap: 7 (ref 5–15)
BUN: 8 mg/dL (ref 6–20)
CO2: 30 mmol/L (ref 22–32)
Calcium: 8.6 mg/dL — ABNORMAL LOW (ref 8.9–10.3)
Chloride: 99 mmol/L — ABNORMAL LOW (ref 101–111)
Creatinine, Ser: 0.76 mg/dL (ref 0.61–1.24)
GFR calc non Af Amer: >60 mL/min (ref >60)
Glucose, Bld: 104 mg/dL — ABNORMAL HIGH (ref 65–99)
Potassium: 4.1 mmol/L (ref 3.5–5.1)
SODIUM: 136 mmol/L (ref 135–145)

## 2015-05-18 LAB — TYPE AND SCREEN
ABO/RH(D): A POS
Antibody Screen: NEGATIVE

## 2015-05-18 LAB — CBC
HEMATOCRIT: 30.5 % — AB (ref 39.0–52.0)
HEMOGLOBIN: 9.8 g/dL — AB (ref 13.0–17.0)
MCH: 32.3 pg (ref 26.0–34.0)
MCHC: 32.1 g/dL (ref 30.0–36.0)
MCV: 100.7 fL — ABNORMAL HIGH (ref 78.0–100.0)
PLATELETS: 482 10*3/uL — AB (ref 150–400)
RBC: 3.03 MIL/uL — AB (ref 4.22–5.81)
RDW: 14.4 % (ref 11.5–15.5)
WBC: 8.5 10*3/uL (ref 4.0–10.5)

## 2015-05-18 MED ORDER — SORBITOL 70 % SOLN
30.0000 mL | Freq: Every day | Status: DC | PRN
  Administered 2015-05-22 – 2015-05-25 (×2): 30 mL via ORAL
  Filled 2015-05-18 (×3): qty 30

## 2015-05-18 MED ORDER — SENNOSIDES-DOCUSATE SODIUM 8.6-50 MG PO TABS
2.0000 | ORAL_TABLET | Freq: Two times a day (BID) | ORAL | Status: DC
  Administered 2015-05-18 – 2015-05-30 (×23): 2 via ORAL
  Filled 2015-05-18 (×23): qty 2

## 2015-05-18 NOTE — Plan of Care (Signed)
Problem: RH BLADDER ELIMINATION Goal: RH STG MANAGE BLADDER WITH ASSISTANCE STG Manage Bladder With mod Assistance  Outcome: Not Applicable Date Met:  95/07/22 Indwelling catheter has been placed  Problem: RH PAIN MANAGEMENT Goal: RH STG PAIN MANAGED AT OR BELOW PT'S PAIN GOAL <3  Outcome: Not Progressing Rating pain a 10, consistently.

## 2015-05-18 NOTE — Progress Notes (Signed)
Physical Therapy Session Note  Patient Details  Name: Steve [name] MRN: [id number] Date of Birth: [birth year]  Today's Date: 05/18/2015 PT Individual Time: 1015-1100 PT Individual Time Calculation (min): 45 min   Short Term Goals: Week 1:  PT Short Term Goal 1 (Week 1): Pt will increase supine to edge of bed, edge of bed to supine with siderail to min A.  PT Short Term Goal 1 - Progress (Week 1): Progressing toward goal PT Short Term Goal 2 (Week 1): Pt will increase transfers bed to chair, chair to bed with LRAD to min A.  PT Short Term Goal 2 - Progress (Week 1): Progressing toward goal PT Short Term Goal 3 (Week 1): Pt will ambulate with LRAD about 25 feet with min A.  PT Short Term Goal 3 - Progress (Week 1): Progressing toward goal PT Short Term Goal 4 (Week 1): Pt will ascend/descend 2 stairs with 1 rail and min A.  PT Short Term Goal 4 - Progress (Week 1): Not progressing PT Short Term Goal 5 (Week 1): Pt will propel w/c about 50 feet with S.  PT Short Term Goal 5 - Progress (Week 1): Not progressing  Skilled Therapeutic Interventions/Progress Updates:  Pt was seen bedside in the am. Pt transferred supine to edge of bed with side rail, head of bed elevated and mod A x 2. Pt transferred edge of bed to w/c with hemiwalker, bed height elevated and mod A with verbal cues. Pt ambulated with R hand rail, max A about 12 feet with w/c follow. Pt ambulated 25 feet with R hand rail, R shoe lift and max A with second person follow with w/c. Following treatment returned to room and pt left sitting up in w/c with family at bedside.   Therapy Documentation Precautions:  Precautions Precautions: Fall Required Braces or Orthoses: Sling Restrictions Weight Bearing Restrictions: Yes LUE Weight Bearing: Non weight bearing RLE Weight Bearing: Weight bearing as tolerated LLE Weight Bearing: Weight bearing as tolerated General:   Pain: Pt c/o mod to severe pain low back.    Locomotion  : Ambulation Ambulation/Gait Assistance: 2: Max assist   See FIM for current functional status  Therapy/Group: Individual Therapy  [name] 05/18/2015, 12:56 PM
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
## 2015-05-18 NOTE — Progress Notes (Signed)
At 2005, PRN robaxin 1000mg 's given along with scheduled trazodone. At 2158 PRN oxy IR 20mg 's given. Rating pain [name] 10 on scale. Complaining of Pelvic pain. Unable to turn or reposition, because of pain. At 2304, PRN xanax given, with scheduled ultram. Ice applied to scrotal area, per patient's request. PRN Lidocaine applied to meatus for complaint of itching and discomfort. At 0456, PRN oxy IR given. Steve [name]

## 2015-05-19 ENCOUNTER — Inpatient Hospital Stay (HOSPITAL_COMMUNITY): Payer: Self-pay

## 2015-05-19 MED ORDER — SIMETHICONE 80 MG PO CHEW
160.0000 mg | CHEWABLE_TABLET | Freq: Four times a day (QID) | ORAL | Status: DC
  Administered 2015-05-19 – 2015-05-30 (×36): 160 mg via ORAL
  Filled 2015-05-19 (×48): qty 2

## 2015-05-19 NOTE — Progress Notes (Signed)
Rested good last night. Unable to turn, because of pain. PRN Robaxin 1000mg 's given at 1959, along with scheduled Oxy SR.  Scheduled ultram given at 2334. PRN Oxy IR given at 0300. Likes to keep bed in trendelenburg position, helps manage pelvic pain. Foley in place. Without complaint of itching or discomfort at meatus. LBM 06/26, stool hard, Senna s started. Alfredo Martinez A

## 2015-05-19 NOTE — Progress Notes (Addendum)
Occupational Therapy Note  Patient Details  Name: Steve [name] MRN: [id number] Date of Birth: May 17, 1981  Today's Date: 05/19/2015 OT Individual Time: 1300-1345 OT Individual Time Calculation (min): 45 min  Pt missed 15 mins; pain and fagigue  Pt c/o 10/10 pain in lower back (near the end of therapy); RN aware and repositioned in bed Individual Therapy  Pt engaged in bed mobility, SPT with hemi walker, and continued discharge planning.  Pt performs SPT with hemi walker from various surface heights with steady A.  Pt is able to perform supine->sit with supervision but requires max A for sit->supine.  Pt attempted to perform supine->sit from therapy mat but required tot A + 2 to complete task.  Pt experiences increased pain when lying flat on therapy mat.  Pt returned to bed and repositioned in trendelenburg to relieve pressure off sacrum/pelvis.      Lavone Neri [facility] 05/19/2015, 1:55 PM

## 2015-05-19 NOTE — Progress Notes (Signed)
Physical Therapy Session Note  Patient Details  Name: Steve [name] MRN: [id number] Date of Birth: [birth year]  Today's Date: 05/19/2015 PT Individual Time: 1105-1205; and 1430-1500 PT Individual Time Calculation (min): 60 min, 30 min  Short Term Goals: Week 2:  PT Short Term Goal 1 (Week 2): Pt will perform bed mobility with HOB elevated with bed rails and with use of leg loops at mod A level  PT Short Term Goal 2 (Week 2): Pt will perform functional transfers w/ LRAD at max A of single therapist PT Short Term Goal 3 (Week 2): Pt will tolerate standing x 3 mins in order to increase WB through LEs.  PT Short Term Goal 4 (Week 2): Pt will propel w/c x 50' (one arm drive chair) at min A level  PT Short Term Goal 5 (Week 2): Pt will tolerate sitting up OOB x 2 hours between therapies.    Skilled Therapeutic Interventions/Progress Updates:    Session 1: Patient up in wheelchair, pushed by staff to just outside therapy gym.  Sit to stand pt pulling up on wall rail minguard assist, pt ambulated 10', and 25' x 2 with min/mod assist facilitation for right trunk/LE extension in stance for improved step with left, cues for decreased step length for improved tolerance and increased fluid motion.  Patient stand step transfer with min assist and hemiwalker wheelchair to bed.  Sit to supine mod assist and pt performed therex as follows: right sidelying assisted left hip flex x 10, supine assisted right hip flexion x 10, supine hip adduction/IR  10, bridging on right x 10.  Patient left supine and positioned for comfort with all needs within reach.  Session 2: Patient in bed, assisted to sitting with mod assist for both LE's. Pt utilizing elevated HOB to come to sit.  Patient transferred to chair with HW min assist.  Armrests and legrests removed and pt sitting edge of chair for reaching forward and right lateral for toiletry items to encourage right and anterior weight shift.  Then standing tolerance with HW for  balance static x 2 minutes for tolerance.  Leaning back in chair pt performed LAQ x 5 w/ 10 sec holds.  Patient declined transfer back to bed as awaiting RN for dressing change (reports does standing in [employer]).  Patient positioned for comfort in chair and tilted back to relieve pressure on pelvis. Call bell in reach and mother in the room.    Therapy Documentation Precautions:  Precautions Precautions: Fall Required Braces or Orthoses: Sling Restrictions Weight Bearing Restrictions: Yes LUE Weight Bearing: Non weight bearing RLE Weight Bearing: Weight bearing as tolerated LLE Weight Bearing: Weight bearing as tolerated Pain: Pain Assessment Pain Assessment: 0-10 Pain Score: 5  Pain Type: Acute pain Pain Location: Pelvis Pain Orientation: Right;Left Pain Descriptors / Indicators: Aching Pain Onset: With Activity Pain Intervention(s): Elevated extremity;Repositioned;Rest Mobility:   Locomotion : Ambulation Ambulation/Gait Assistance: 3: Mod assist   See FIM for current functional status  Therapy/Group: Individual Therapy  Rudi CocoWYNN,CYNDI  Cyndi CarthageWynn, [place]PT [phone] 05/19/2015  05/19/2015, 4:29 PM
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
## 2015-05-19 NOTE — Progress Notes (Signed)
Occupational Therapy Session Note  Patient Details  Name: Steve [name] MRN: [id number] Date of Birth: [birth year]  Today's Date: 05/19/2015 OT Individual Time: 0900-1000 OT Individual Time Calculation (min): 60 min    Short Term Goals: Week 2:  OT Short Term Goal 1 (Week 2): Pt. will perform supine to EOB with min assist to prepare for ADLtaken  OT Short Term Goal 2 (Week 2): Pt will perform bathing with min A supine and sitting EOB OT Short Term Goal 3 (Week 2): Pt will performe LB dressing with mod A OT Short Term Goal 4 (Week 2): [facility] Short Term Goal 5 (Week 2): Pt will perform toileting tasks with mod A  Skilled Therapeutic Interventions/Progress Updates:    Pt engaged in skilled OT session with focus on bed mobility, functional transfers, directing care, activity tolerance, and sitting balance.  Pt performed supine->sit EOB using bed rails with steady A to lower RLE.  Pt was able to maintain static sitting balance EOB without UE support.  Pt performed sit<>stand and SPT with hemi-walker to w/c with steady A.  Pt completed grooming tasks and UB bathing tasks seated in w/c in partially tilted position to relieve pressure off sacrum and pelvis.  Pt requires more than reasonable amount of time to complete tasks and multiple rest breaks secondary to increased pain with transitional movements.  Therapy Documentation Precautions:  Precautions Precautions: Fall Required Braces or Orthoses: Sling Restrictions Weight Bearing Restrictions: Yes LUE Weight Bearing: Non weight bearing RLE Weight Bearing: Weight bearing as tolerated LLE Weight Bearing: Weight bearing as tolerated  Pain: Pain Assessment Pain Assessment: 0-10 Pain Score: 6  Pain Type: Acute pain Pain Location: Pelvis Pain Orientation: Right;Left Pain Descriptors / Indicators: Aching;Grimacing Pain Frequency: Intermittent Pain Onset: On-going Pain Intervention(s): Medication (See eMAR)  See FIM  for current functional status  Therapy/Group: Individual Therapy  Rich BraveLanier, Jackey Housey Chappell 05/19/2015, 10:03 AM

## 2015-05-20 ENCOUNTER — Encounter (HOSPITAL_COMMUNITY)
Admission: AD | Disposition: A | Discharge status: Home Health | Payer: Self-pay | Source: Intra-hospital | Attending: Physical Medicine & Rehabilitation

## 2015-05-20 ENCOUNTER — Inpatient Hospital Stay (HOSPITAL_COMMUNITY): Payer: Self-pay | Admitting: Physical Therapy

## 2015-05-20 ENCOUNTER — Inpatient Hospital Stay (HOSPITAL_COMMUNITY): Payer: Self-pay

## 2015-05-20 ENCOUNTER — Inpatient Hospital Stay (HOSPITAL_COMMUNITY): Payer: Worker's Compensation | Admitting: Physical Therapy

## 2015-05-20 ENCOUNTER — Inpatient Hospital Stay (HOSPITAL_COMMUNITY): Payer: Worker's Compensation | Admitting: Anesthesiology

## 2015-05-20 ENCOUNTER — Inpatient Hospital Stay (HOSPITAL_COMMUNITY): Payer: Worker's Compensation

## 2015-05-20 ENCOUNTER — Inpatient Hospital Stay (HOSPITAL_COMMUNITY): Admission: RE | Admit: 2015-05-20 | Payer: Worker's Compensation | Source: Ambulatory Visit | Admitting: Plastic Surgery

## 2015-05-20 ENCOUNTER — Encounter (HOSPITAL_COMMUNITY): Payer: Self-pay | Admitting: Critical Care Medicine

## 2015-05-20 HISTORY — PX: APPLICATION OF A-CELL OF BACK: SHX6301

## 2015-05-20 HISTORY — PX: APPLICATION OF A-CELL OF EXTREMITY: SHX6303

## 2015-05-20 HISTORY — PX: I&D EXTREMITY: SHX5045

## 2015-05-20 SURGERY — IRRIGATION AND DEBRIDEMENT EXTREMITY
Anesthesia: General | Site: Back | Laterality: Left

## 2015-05-20 MED ORDER — CALCIUM CHLORIDE 10 % IV SOLN
INTRAVENOUS | Status: AC
  Filled 2015-05-20: qty 10

## 2015-05-20 MED ORDER — SODIUM CHLORIDE 0.9 % IR SOLN
Status: DC | PRN
  Administered 2015-05-20: 1000 mL

## 2015-05-20 MED ORDER — KCL IN DEXTROSE-NACL 20-5-0.45 MEQ/L-%-% IV SOLN
INTRAVENOUS | Status: DC
  Administered 2015-05-20: 21:00:00 via INTRAVENOUS
  Filled 2015-05-20 (×3): qty 1000

## 2015-05-20 MED ORDER — KETAMINE HCL 10 MG/ML IJ SOLN
INTRAMUSCULAR | Status: DC | PRN
  Administered 2015-05-20: 20 mg via INTRAVENOUS

## 2015-05-20 MED ORDER — OXYCODONE HCL 5 MG/5ML PO SOLN
ORAL | Status: AC
  Filled 2015-05-20: qty 25

## 2015-05-20 MED ORDER — EPINEPHRINE HCL 1 MG/ML IJ SOLN
INTRAMUSCULAR | Status: DC | PRN
  Administered 2015-05-20: 1 mg

## 2015-05-20 MED ORDER — ACETAMINOPHEN 10 MG/ML IV SOLN
INTRAVENOUS | Status: AC
  Filled 2015-05-20: qty 100

## 2015-05-20 MED ORDER — MIDAZOLAM HCL 2 MG/2ML IJ SOLN
INTRAMUSCULAR | Status: AC
  Filled 2015-05-20: qty 2

## 2015-05-20 MED ORDER — PHENYLEPHRINE HCL 10 MG/ML IJ SOLN
INTRAMUSCULAR | Status: DC | PRN
Start: 2015-05-20 — End: 2015-05-20
  Administered 2015-05-20: 120 ug via INTRAVENOUS

## 2015-05-20 MED ORDER — HYDROMORPHONE HCL 1 MG/ML IJ SOLN
INTRAMUSCULAR | Status: AC
  Filled 2015-05-20: qty 2

## 2015-05-20 MED ORDER — FENTANYL CITRATE (PF) 100 MCG/2ML IJ SOLN
INTRAMUSCULAR | Status: DC | PRN
Start: 2015-05-20 — End: 2015-05-20
  Administered 2015-05-20 (×4): 50 ug via INTRAVENOUS
  Administered 2015-05-20: 100 ug via INTRAVENOUS
  Administered 2015-05-20 (×7): 50 ug via INTRAVENOUS
  Administered 2015-05-20: 100 ug via INTRAVENOUS

## 2015-05-20 MED ORDER — EPINEPHRINE HCL 1 MG/ML IJ SOLN
INTRAMUSCULAR | Status: AC
  Filled 2015-05-20: qty 1

## 2015-05-20 MED ORDER — OXYCODONE HCL 5 MG/5ML PO SOLN
5.0000 mg | Freq: Once | ORAL | Status: AC | PRN
  Administered 2015-05-20: 5 mg via ORAL

## 2015-05-20 MED ORDER — ROCURONIUM BROMIDE 100 MG/10ML IV SOLN
INTRAVENOUS | Status: DC | PRN
  Administered 2015-05-20: 40 mg via INTRAVENOUS

## 2015-05-20 MED ORDER — FENTANYL CITRATE (PF) 250 MCG/5ML IJ SOLN
INTRAMUSCULAR | Status: AC
  Filled 2015-05-20: qty 5

## 2015-05-20 MED ORDER — DEXAMETHASONE SODIUM PHOSPHATE 4 MG/ML IJ SOLN
INTRAMUSCULAR | Status: DC | PRN
  Administered 2015-05-20: 4 mg via INTRAVENOUS

## 2015-05-20 MED ORDER — LIDOCAINE HCL (CARDIAC) 20 MG/ML IV SOLN
INTRAVENOUS | Status: DC | PRN
  Administered 2015-05-20: 70 mg via INTRAVENOUS

## 2015-05-20 MED ORDER — OXYCODONE HCL 5 MG PO TABS: 5.0000 mg | ORAL_TABLET | Freq: Once | ORAL | Status: AC | PRN

## 2015-05-20 MED ORDER — KETAMINE HCL 100 MG/ML IJ SOLN
INTRAMUSCULAR | Status: AC
  Filled 2015-05-20: qty 1

## 2015-05-20 MED ORDER — PROPOFOL 10 MG/ML IV BOLUS
INTRAVENOUS | Status: DC | PRN
  Administered 2015-05-20: 40 mg via INTRAVENOUS
  Administered 2015-05-20: 160 mg via INTRAVENOUS

## 2015-05-20 MED ORDER — GLYCOPYRROLATE 0.2 MG/ML IJ SOLN
INTRAMUSCULAR | Status: DC | PRN
  Administered 2015-05-20: 0.4 mg via INTRAVENOUS

## 2015-05-20 MED ORDER — NEOSTIGMINE METHYLSULFATE 10 MG/10ML IV SOLN
INTRAVENOUS | Status: DC | PRN
  Administered 2015-05-20: 3 mg via INTRAVENOUS

## 2015-05-20 MED ORDER — ONDANSETRON HCL 4 MG/2ML IJ SOLN
INTRAMUSCULAR | Status: DC | PRN
  Administered 2015-05-20: 4 mg via INTRAVENOUS

## 2015-05-20 MED ORDER — MORPHINE SULFATE 2 MG/ML IJ SOLN
2.0000 mg | INTRAMUSCULAR | Status: DC | PRN
  Administered 2015-05-27: 2 mg via INTRAVENOUS
  Filled 2015-05-20: qty 1

## 2015-05-20 MED ORDER — HYDROMORPHONE HCL 1 MG/ML IJ SOLN
0.2500 mg | INTRAMUSCULAR | Status: DC | PRN
  Administered 2015-05-20: 2 mg via INTRAVENOUS
  Administered 2015-05-20 (×2): 1 mg via INTRAVENOUS

## 2015-05-20 MED ORDER — CEFAZOLIN SODIUM-DEXTROSE 2-3 GM-% IV SOLR
INTRAVENOUS | Status: DC | PRN
  Administered 2015-05-20: 2 g via INTRAVENOUS

## 2015-05-20 MED ORDER — MIDAZOLAM HCL 5 MG/5ML IJ SOLN
INTRAMUSCULAR | Status: DC | PRN
  Administered 2015-05-20: 2 mg via INTRAVENOUS

## 2015-05-20 MED ORDER — HYDROMORPHONE HCL 1 MG/ML IJ SOLN
INTRAMUSCULAR | Status: DC | PRN
  Administered 2015-05-20 (×4): .25 mg via INTRAVENOUS

## 2015-05-20 MED ORDER — ACETAMINOPHEN 10 MG/ML IV SOLN: 1000.0000 mg | Freq: Once | INTRAVENOUS | Status: DC

## 2015-05-20 MED ORDER — LACTATED RINGERS IV SOLN
INTRAVENOUS | Status: DC
  Administered 2015-05-20 (×4): via INTRAVENOUS

## 2015-05-20 MED ORDER — HYDROMORPHONE HCL 1 MG/ML IJ SOLN
INTRAMUSCULAR | Status: AC
  Filled 2015-05-20: qty 1

## 2015-05-20 MED ORDER — KETOROLAC TROMETHAMINE 30 MG/ML IJ SOLN
INTRAMUSCULAR | Status: AC
  Filled 2015-05-20: qty 1

## 2015-05-20 MED ORDER — KETOROLAC TROMETHAMINE 30 MG/ML IJ SOLN
30.0000 mg | Freq: Once | INTRAMUSCULAR | Status: AC
  Administered 2015-05-20: 30 mg via INTRAVENOUS

## 2015-05-20 SURGICAL SUPPLY — 47 items
BANDAGE ELASTIC 4 VELCRO ST LF (GAUZE/BANDAGES/DRESSINGS) IMPLANT
BINDER BREAST XLRG (GAUZE/BANDAGES/DRESSINGS) ×5 IMPLANT
BLADE 10 SAFETY STRL DISP (BLADE) ×25 IMPLANT
BLADE SURG ROTATE 9660 (MISCELLANEOUS) IMPLANT
BLADE WECPREP (GOULIAN KNIFE) (BLADE) ×20 IMPLANT
BNDG COHESIVE 4X5 TAN STRL (GAUZE/BANDAGES/DRESSINGS) ×5 IMPLANT
BNDG GAUZE ELAST 4 BULKY (GAUZE/BANDAGES/DRESSINGS) ×5 IMPLANT
BURN MATRIX MATRISTEM 10X15 (Tissue) ×15 IMPLANT
BURN MATRIX MATRISTEM 7X10 (Orthopedic Implant) ×5 IMPLANT
CANISTER SUCTION 2500CC (MISCELLANEOUS) ×5 IMPLANT
CHLORAPREP W/TINT 26ML (MISCELLANEOUS) IMPLANT
COVER SURGICAL LIGHT HANDLE (MISCELLANEOUS) ×5 IMPLANT
DRAPE EXTREMITY T 121X128X90 (DRAPE) IMPLANT
DRAPE ORTHO SPLIT 77X108 STRL (DRAPES) ×4
DRAPE PROXIMA HALF (DRAPES) ×5 IMPLANT
DRAPE SURG ORHT 6 SPLT 77X108 (DRAPES) ×6 IMPLANT
DRSG ADAPTIC 3X8 NADH LF (GAUZE/BANDAGES/DRESSINGS) ×10 IMPLANT
DRSG PAD ABDOMINAL 8X10 ST (GAUZE/BANDAGES/DRESSINGS) ×20 IMPLANT
DRSG TELFA 3X8 NADH (GAUZE/BANDAGES/DRESSINGS) ×20 IMPLANT
ELECT REM PT RETURN 9FT ADLT (ELECTROSURGICAL) ×5
ELECTRODE REM PT RTRN 9FT ADLT (ELECTROSURGICAL) ×3 IMPLANT
FILTER STRAW FLUID ASPIR (MISCELLANEOUS) ×5 IMPLANT
GAUZE SPONGE 4X4 12PLY STRL (GAUZE/BANDAGES/DRESSINGS) IMPLANT
GLOVE BIO SURGEON STRL SZ 6 (GLOVE) ×5 IMPLANT
GLOVE BIO SURGEON STRL SZ 6.5 (GLOVE) ×4 IMPLANT
GLOVE BIO SURGEONS STRL SZ 6.5 (GLOVE) ×1
GOWN STRL REUS W/ TWL LRG LVL3 (GOWN DISPOSABLE) ×9 IMPLANT
GOWN STRL REUS W/TWL LRG LVL3 (GOWN DISPOSABLE) ×6
HANDPIECE INTERPULSE COAX TIP (DISPOSABLE)
KIT BASIN OR (CUSTOM PROCEDURE TRAY) ×5 IMPLANT
KIT ROOM TURNOVER OR (KITS) ×5 IMPLANT
MICROMATRIX 500MG (Tissue) ×5 IMPLANT
NS IRRIG 1000ML POUR BTL (IV SOLUTION) ×15 IMPLANT
PACK GENERAL/GYN (CUSTOM PROCEDURE TRAY) ×5 IMPLANT
PAD ABD 8X10 STRL (GAUZE/BANDAGES/DRESSINGS) ×5 IMPLANT
PAD ARMBOARD 7.5X6 YLW CONV (MISCELLANEOUS) ×10 IMPLANT
SET HNDPC FAN SPRY TIP SCT (DISPOSABLE) IMPLANT
SLING ARM FOAM STRAP XLG (SOFTGOODS) ×5 IMPLANT
SOLUTION PARTIC MCRMTRX 500MG (Tissue) ×3 IMPLANT
STAPLER VISISTAT 35W (STAPLE) ×5 IMPLANT
STOCKINETTE IMPERVIOUS 9X36 MD (GAUZE/BANDAGES/DRESSINGS) ×5 IMPLANT
SUT VICRYL 4-0 PS2 18IN ABS (SUTURE) ×70 IMPLANT
TAPE CLOTH SURG 4X10 WHT LF (GAUZE/BANDAGES/DRESSINGS) ×5 IMPLANT
TOWEL OR 17X24 6PK STRL BLUE (TOWEL DISPOSABLE) ×5 IMPLANT
TOWEL OR 17X26 10 PK STRL BLUE (TOWEL DISPOSABLE) ×10 IMPLANT
UNDERPAD 30X30 INCONTINENT (UNDERPADS AND DIAPERS) ×15 IMPLANT
WATER STERILE IRR 1000ML POUR (IV SOLUTION) IMPLANT
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
## 2015-05-20 NOTE — Progress Notes (Signed)
Occupational Therapy Session Note  Patient Details  Name: Steve KaysMatthew B Boxley MRN: [id number] Date of Birth: 01/21/1981  Today's Date: 05/20/2015 OT Individual Time:1100-1100  0 minutes  Short Term Goals: Week 2:  OT Short Term Goal 1 (Week 2): Pt. will perform supine to EOB with min assist to prepare for ADLtaken  OT Short Term Goal 2 (Week 2): Pt will perform bathing with min A supine and sitting EOB OT Short Term Goal 3 (Week 2): Pt will performe LB dressing with mod A OT Short Term Goal 4 (Week 2): [facility] Short Term Goal 5 (Week 2): Pt will perform toileting tasks with mod A  Skilled Therapeutic Interventions/Progress Updates:  Pt received supine in bed with family present.   Pt reports pain w/o relief and requests suspension of treatment during this session.   No skilled therapy provided d/t pain.     Therapy Documentation Precautions:  Precautions Precautions: Fall Required Braces or Orthoses: Sling Restrictions Weight Bearing Restrictions: Yes LUE Weight Bearing: Non weight bearing RLE Weight Bearing: Weight bearing as tolerated LLE Weight Bearing: Weight bearing as tolerated   Vital Signs: Therapy Vitals Temp: 98.1 F (36.7 C) Temp Source: Oral Pulse Rate: 63 Resp: 19 BP: (!) 110/58 mmHg Patient Position (if appropriate): Lying Oxygen Therapy SpO2: 95 % O2 Device: Not Delivered   Pain: Pain Assessment Pain Assessment: 0-10 Pain Score: 7  Pain Type: Acute pain Pain Location: Back Pain Orientation: Right;Left Pain Descriptors / Indicators: Aching Pain Frequency: Constant Pain Onset: On-going Patients Stated Pain Goal: 3 Pain Intervention(s): Medication (See eMAR);Repositioned   See FIM for current functional status  Therapy/Group: Individual Therapy  Octavius Shin 05/20/2015, 6:58 AM

## 2015-05-20 NOTE — Progress Notes (Signed)
Dr ggermeroth at bedside/ giving ketamine

## 2015-05-20 NOTE — Plan of Care (Signed)
Problem: RH BLADDER ELIMINATION Goal: RH STG MANAGE BLADDER WITH EQUIPMENT WITH ASSISTANCE STG Manage Bladder With Equipment With total Assistance  Outcome: Not Progressing Foley catheter

## 2015-05-20 NOTE — Anesthesia Procedure Notes (Signed)
Procedure Name: Intubation Date/Time: 05/20/2015 1:03 PM Performed by: Glo HerringLEE, Ulysees Robarts B Pre-anesthesia Checklist: Patient identified, Timeout performed, Emergency Drugs available, Suction available and Patient being monitored Patient Re-evaluated:Patient Re-evaluated prior to inductionOxygen Delivery Method: Circle system utilized Preoxygenation: Pre-oxygenation with 100% oxygen Intubation Type: IV induction Ventilation: Mask ventilation without difficulty Laryngoscope Size: Mac and 4 Grade View: Grade I Tube type: Oral Tube size: 7.5 mm Number of attempts: 1 Airway Equipment and Method: Stylet Placement Confirmation: CO2 detector,  positive ETCO2,  ETT inserted through vocal cords under direct vision and breath sounds checked- equal and bilateral Secured at: 24 cm Tube secured with: Tape Dental Injury: Teeth and Oropharynx as per pre-operative assessment
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
## 2015-05-20 NOTE — Progress Notes (Signed)
Physical Therapy Note  Patient Details  Name: Steve [name] MRN: [id number] Date of Birth: [birth year] Today's Date: 05/20/2015  Patient missed 30 min skilled physical therapy while off unit for debridement surgery. Will f/u per POC.    Kerney ElbeVarner, Manda Holstad A 05/20/2015, 5:44 PM
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
## 2015-05-20 NOTE — Anesthesia Preprocedure Evaluation (Signed)
Anesthesia Evaluation  Patient identified by MRN, date of birth, ID band Patient awake    Reviewed: Allergy & Precautions, NPO status , Patient's Chart, lab work & pertinent test results  History of Anesthesia Complications Negative for: history of anesthetic complications  Airway Mallampati: III  TM Distance: >3 FB Neck ROM: Full    Dental  (+) Poor Dentition   Pulmonary neg shortness of breath, neg sleep apnea, neg COPDneg recent URI, Current Smoker,  breath sounds clear to auscultation        Cardiovascular negative cardio ROS  Rhythm:Regular     Neuro/Psych negative neurological ROS  negative psych ROS   GI/Hepatic negative GI ROS, Neg liver ROS,   Endo/Other  negative endocrine ROS  Renal/GU      Musculoskeletal Multiple fractures   Abdominal   Peds  Hematology  (+) anemia ,   Anesthesia Other Findings   Reproductive/Obstetrics                             Anesthesia Physical Anesthesia Plan  ASA: II  Anesthesia Plan: General   Post-op Pain Management:    Induction: Intravenous  Airway Management Planned: Oral ETT  Additional Equipment: None  Intra-op Plan:   Post-operative Plan: Extubation in OR  Informed Consent: I have reviewed the patients History and Physical, chart, labs and discussed the procedure including the risks, benefits and alternatives for the proposed anesthesia with the patient or authorized representative who has indicated his/her understanding and acceptance.   Dental advisory given  Plan Discussed with: CRNA and Surgeon  Anesthesia Plan Comments:         Anesthesia Quick Evaluation

## 2015-05-20 NOTE — Transfer of Care (Signed)
Immediate Anesthesia Transfer of Care Note  Patient: Steve [name]  Procedure(s) Performed: Procedure(s): DEBRIDEMENT OF LEFT UPPER EXTREMITY and TRUNK with APPLICATION OF A CELL (Left) APPLICATION OF A-CELL OF left BACK (Left) APPLICATION OF A-CELL OF Left Upper Arm (Left)  Patient Location: PACU  Anesthesia Type:General  Level of Consciousness: awake, alert  and oriented  Airway & Oxygen Therapy: Patient Spontanous Breathing and Patient connected to nasal cannula oxygen  Post-op Assessment: Report given to RN, Post -op Vital signs reviewed and stable and Patient moving all extremities X 4  Post vital signs: Reviewed and stable  Last Vitals:  Filed Vitals:   05/20/15 0605  BP: 110/58  Pulse: 63  Temp: 36.7 C  Resp: 19    Complications: No apparent anesthesia complications

## 2015-05-20 NOTE — Progress Notes (Signed)
Physical Therapy Session Note  Patient Details  Name: Kem KaysMatthew B Eliasen MRN: [id number] Date of Birth: [birth year]  Today's Date: 05/20/2015 PT Individual Time: 0820-0917 and 1130-1150 PT Individual Time Calculation (min): 57 min and 20 min   Short Term Goals: Week 2:  PT Short Term Goal 1 (Week 2): Pt will perform bed mobility with HOB elevated with bed rails and with use of leg loops at mod A level  PT Short Term Goal 2 (Week 2): Pt will perform functional transfers w/ LRAD at max A of single therapist PT Short Term Goal 3 (Week 2): Pt will tolerate standing x 3 mins in order to increase WB through LEs.  PT Short Term Goal 4 (Week 2): Pt will propel w/c x 50' (one arm drive chair) at min A level  PT Short Term Goal 5 (Week 2): Pt will tolerate sitting up OOB x 2 hours between therapies.    Skilled Therapeutic Interventions/Progress Updates:   Session 1: Patient received in bed reporting increased 10/10 back pain and agreeable to getting up in wheelchair. Patient utilized hospital bed functions to elevate HOB and required mod A to bring BLE off edge of bed due to increase pain. Patient performed stand step turn transfer using hemiwalker on R from bed > wheelchair. Patient tilted back in wheelchair for seating tolerance and pain management and RN notified of need for pain medication. Patient's IV blocked and patient had to receive pain medications IM with increased pain at site of needle. Patient tolerated sitting up in tilted wheelchair for appox 20-30 min. Due to increased pain, patient requesting to return to bed. Performed stand pivot transfer back to bed with min A, mod A for sit > supine. Patient utilized hospital bed in Trendelenberg to reposition higher in bed and left semi reclined with pillows positioned for comfort and improved BLE positioning, with all needs within reach. Patient required greatly increased time for all mobility due to increased pain and no pain medication prior to session.    Session 2: Patient received in bed with increased back pain but declined sitting edge of bed to reposition, stating, "That will hurt too." Patient agreeable to bed level BLE therex: active assisted heel slides x 5 each LE, quad sets with 5 sec hold x 15 each LE, ankle pumps x 20, assisted SLR x 2 each LE for removing and repositioning pillows at end of session. Patient missed 10 min due to transport arriving to take patient to debridement surgery.   Therapy Documentation Precautions:  Precautions Precautions: Fall Required Braces or Orthoses: Sling Restrictions Weight Bearing Restrictions: Yes LUE Weight Bearing: Non weight bearing RLE Weight Bearing: Weight bearing as tolerated LLE Weight Bearing: Weight bearing as tolerated General: PT Amount of Missed Time (min): 10 Minutes PT Missed Treatment Reason: Unavailable (Comment) (transport to surgery) Pain: Pain Assessment Pain Assessment: Faces Faces Pain Scale: Hurts worst Pain Type: Acute pain Pain Location: Hip Pain Orientation: Right;Left Pain Descriptors / Indicators: Aching;Crying;Discomfort;Grimacing Pain Frequency: Constant Pain Onset: On-going Patients Stated Pain Goal: 3 Pain Intervention(s): RN made aware;Repositioned;Emotional support  See FIM for current functional status  Therapy/Group: Individual Therapy  Kerney ElbeVarner, Kenyia Wambolt A 05/20/2015, 9:20 AM

## 2015-05-20 NOTE — Progress Notes (Signed)
Occupational Therapy Note  Patient Details  Name: Kem KaysMatthew B Fedora MRN: [id number] Date of Birth: 01-Sep-1981  Today's Date: 05/20/2015 OT Missed Time: 60 Minutes Missed Time Reason: Pain (60)  Pt c/o 10/10 pain in lower back and sacrum; RN aware and repositioned in trendelenburg in bed to relieve pressure  Pt missed 60 mins skilled OT services secondary to increased pain and unable to take any oral medicines secondary to scheduled procedure later in the morning.  IV nurse in room flushing IV which increased pain.      [name], Brenee Gajda [facility] 05/20/2015, 10:34 AM

## 2015-05-21 ENCOUNTER — Inpatient Hospital Stay (HOSPITAL_COMMUNITY): Payer: Worker's Compensation | Admitting: Physical Therapy

## 2015-05-21 ENCOUNTER — Inpatient Hospital Stay (HOSPITAL_COMMUNITY): Payer: Self-pay | Admitting: Occupational Therapy

## 2015-05-21 ENCOUNTER — Inpatient Hospital Stay (HOSPITAL_COMMUNITY): Payer: Self-pay

## 2015-05-21 ENCOUNTER — Encounter (HOSPITAL_COMMUNITY): Payer: Self-pay | Admitting: Plastic Surgery
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
## 2015-05-21 NOTE — Progress Notes (Signed)
IV team called about IV insertion consult.  IV RN verified placement would be before dressing change tomorrow to ensure IV pain meds could be received by patient but would let patient sleep tonight.

## 2015-05-21 NOTE — Progress Notes (Signed)
Physical Therapy Note  Patient Details  Name: Steve [name] MRN: [id number] Date of Birth: Dec 02, 1980 Today's Date: 05/21/2015  Patient's plan of care adjusted to 15/7 after speaking with care team and discussed with PA as patient unable to tolerate current therapy schedule with OT and PT.   Bayard HuggerVarner, Steve [name] 05/21/2015, 5:09 PM

## 2015-05-21 NOTE — Progress Notes (Signed)
Occupational Therapy Note  Patient Details  Name: Steve KaysMatthew B [name] MRN: [id number] Date of Birth: 1981/01/01  Today's Date: 05/21/2015 OT Individual Time: 1300-1430 OT Individual Time Calculation (min): 90 min   Pt c/o 8/10 pain in left upper back with movement and lower back/pelvis when sitting; RN aware and repositioned Individual Therapy  Pt asleep in bed upon arrival with wife present.  Pt easily aroused and agreeable to participating in therapy.  Pt had Ted hose donned.  Pt required mod A for supine->sit EOB with increased pain in left upper back with initial movement in bed.  Pt requested a pillow be placed under buttocks while sitting EOB.  BP 117/62 seated EOB.  Pt performed SPT with HW to w/c.  Pt engaged in sit<>stand and static standing balance tasks with no reported lightheadedness.  Pt transitioned to main entrance of hospital and engaged in sitting upright in w/c and sit<>stand.  Pt tolerated therapy well and remained in w/c at end of session.  Focus on activity tolerance, sitting tolerance, sit<>stand, standing balance, and safety awareness.    [name], [name] 05/21/2015, 3:08 PM

## 2015-05-21 NOTE — Progress Notes (Addendum)
Physical Therapy Session Note  Patient Details  Name: Steve [name] MRN: [id number] Date of Birth: September 25, 1981  Today's Date: 05/21/2015 PT Individual Time: 1000-1053 PT Individual Time Calculation (min): 53 min   Short Term Goals: Week 2:  PT Short Term Goal 1 (Week 2): Pt will perform bed mobility with HOB elevated with bed rails and with use of leg loops at mod [name] level  PT Short Term Goal 2 (Week 2): Pt will perform functional transfers w/ LRAD at max [name] of single therapist PT Short Term Goal 3 (Week 2): Pt will tolerate standing x 3 mins in order to increase WB through LEs.  PT Short Term Goal 4 (Week 2): Pt will propel w/c x 50' (one arm drive chair) at min [name] level  PT Short Term Goal 5 (Week 2): Pt will tolerate sitting up OOB x 2 hours between therapies.    Skilled Therapeutic Interventions/Progress Updates:   Patient semi reclined in bed with wife present, RN and primary OT notified this therapist of orthostasis in earlier session. Patient agreeable to sitting edge of bed. Focused session on patient directing care to improve autonomy and pain management. Donned shorts in bed with wife assisting and patient performing half bridge on R to get pants over R hip and rolling to get pants over L hip. With increased time and use of leg lifter, patient transferred supine > sit with min [name] to bring outer RLE lowered off bed and use of hospital bed functions. Patient able to tolerate sitting edge of bed for approx 30 min while engaging in discussion with patient and wife regarding transportation and plan to complete actual car transfer next week as well as DME needs. Patient performed sit <> stand x 2 from elevated bed using HW with supervision for wife to place/remove pillow under buttocks for comfort sitting edge of bed. Orthostatic vitals obtained and patient symptomatic, see vitals below. Patient transferred sit > supine with mod [name] for lifting BLE with use of hospital bed functions and patient  repositioned with pillows for pressure relief and improved positioning in bed. Patient left in trendelenberg with call bell within reach and wife in room. RN and PA aware of BP readings.   Therapy Documentation Precautions:  Precautions Precautions: Fall Required Braces or Orthoses: Sling Restrictions Weight Bearing Restrictions: Yes LUE Weight Bearing: Non weight bearing RLE Weight Bearing: Weight bearing as tolerated LLE Weight Bearing: Weight bearing as tolerated Vital Signs: Therapy Vitals Patient Position (if appropriate): Orthostatic Vitals  Supine 111/57 Sitting edge of bed 92/57 Standing 83/47 Pain: Pain Assessment Pain Assessment: Faces Faces Pain Scale: Hurts even more Pain Type: Acute pain Pain Location: Back Pain Descriptors / Indicators: Aching;Constant;Discomfort;Grimacing Pain Onset: On-going Pain Intervention(s): Repositioned (premedicated)  See FIM for current functional status  Therapy/Group: Individual Therapy  Kerney ElbeVarner, Steve [name] 05/21/2015, 12:33 PM
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
## 2015-05-21 NOTE — Progress Notes (Signed)
Occupational Therapy Session Note  Patient Details  Name: Steve KaysMatthew B [name] MRN: [id number] Date of Birth: 05-Feb-1981  Today's Date: 05/21/2015 OT Individual Time: 0900-1000 OT Individual Time Calculation (min): 60 min    Short Term Goals: Week 2:  OT Short Term Goal 1 (Week 2): Pt. will perform supine to EOB with min assist to prepare for ADLtaken  OT Short Term Goal 2 (Week 2): Pt will perform bathing with min A supine and sitting EOB OT Short Term Goal 3 (Week 2): Pt will performe LB dressing with mod A OT Short Term Goal 4 (Week 2): [facility] Short Term Goal 5 (Week 2): Pt will perform toileting tasks with mod A  Skilled Therapeutic Interventions/Progress Updates:    Pt engaged in bed mobility, sitting balance, sit<>stand, and standing balance.  Pt required min A for supine->sit EOB with more than reasonable amount of time.  Initial goal of standing to bathe buttocks and periarea.  After standing approx 30 seconds patient reported being lightheaded.  BP sitting EOB at 80/53 and HR 97.  Pt returned to supine and BP at 108/57.  Pt attempted to sit EOB again but reported increased lightheadedness.  RN and PA aware.  Pt reported increased pain in upper left back with activity but did not report any increased pain in pelvis.  Focus on activity tolerance, bed mobility, sit<>stand, standing balance, and safety awareness.  Wife present throughout session.   Therapy Documentation Precautions:  Precautions Precautions: Fall Required Braces or Orthoses: Sling Restrictions Weight Bearing Restrictions: Yes LUE Weight Bearing: Non weight bearing RLE Weight Bearing: Weight bearing as tolerated LLE Weight Bearing: Weight bearing as tolerated   Pain:  Pt c/o 10/10 pain in lower back and left upper back; RN aware and repositioned.  See FIM for current functional status  Therapy/Group: Individual Therapy  Rich BraveLanier, Costa Jha Chappell 05/21/2015, 12:16 PM
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
## 2015-05-22 ENCOUNTER — Inpatient Hospital Stay (HOSPITAL_COMMUNITY): Payer: Self-pay

## 2015-05-22 ENCOUNTER — Inpatient Hospital Stay (HOSPITAL_COMMUNITY): Payer: Worker's Compensation | Admitting: Physical Therapy

## 2015-05-22 MED ORDER — FLEET ENEMA 7-19 GM/118ML RE ENEM
1.0000 | ENEMA | Freq: Every day | RECTAL | Status: DC | PRN
  Administered 2015-05-25: 1 via RECTAL
  Filled 2015-05-22 (×2): qty 1

## 2015-05-22 MED ORDER — HYDROMORPHONE HCL 2 MG PO TABS
4.0000 mg | ORAL_TABLET | Freq: Four times a day (QID) | ORAL | Status: DC | PRN
  Administered 2015-05-22 – 2015-05-30 (×10): 4 mg via ORAL
  Filled 2015-05-22 (×10): qty 2

## 2015-05-22 MED ORDER — SORBITOL 70 % SOLN
60.0000 mL | Status: AC
  Administered 2015-05-22: 60 mL via ORAL
  Filled 2015-05-22: qty 60
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
## 2015-05-22 NOTE — Progress Notes (Signed)
Occupational Therapy Session Note  Patient Details  Name: Steve [name] MRN: [id number] Date of Birth: 1981/10/25  Today's Date: 05/22/2015 OT Individual Time: 0900-1000 OT Individual Time Calculation (min): 60 min  and Today's Date: 05/22/2015 OT Missed Time: 30 Minutes Missed Time Reason: Patient fatigue;Pain   Short Term Goals: Week 2:  OT Short Term Goal 1 (Week 2): Pt. will perform supine to EOB with min assist to prepare for ADLtaken  OT Short Term Goal 2 (Week 2): Pt will perform bathing with min A supine and sitting EOB OT Short Term Goal 3 (Week 2): Pt will performe LB dressing with mod A OT Short Term Goal 4 (Week 2): [facility] Short Term Goal 5 (Week 2): Pt will perform toileting tasks with mod A  Skilled Therapeutic Interventions/Progress Updates:    Pt resting in tilt-in-space w/c with wife present upon arrival.  Pt stated [facility] but wasn't able to have a BM.  Pt stated he was in considerable pain in back, pelvis, and stomach but was willing to attempt therapy.  Pt stood X 4 to bathe periarea and doff/donn shorts.  Pt was able to stand for approx 30 seconds.  Pt required extended rest breaks between standing secondary to increased pain with transitional movements.  Pt repositioned in w/c and immediately went to sleep and session terminated secondary to increased pain and fatigue. Focus on increased activity tolerance, sit<>stand, standing balance to increase independence with BADLs.   Therapy Documentation Precautions:  Precautions Precautions: Fall Required Braces or Orthoses: Sling Restrictions Weight Bearing Restrictions: Yes LUE Weight Bearing: Non weight bearing RLE Weight Bearing: Weight bearing as tolerated LLE Weight Bearing: Weight bearing as tolerated General: General OT Amount of Missed Time: 30 Minutes Pain: Pain Assessment Pain Assessment: 0-10 Pain Score: 8  Pain Type: Acute pain Pain Location:  Pelvis Pain Orientation: Right;Left Pain Descriptors / Indicators: Aching;Discomfort;Grimacing;Moaning Pain Onset: On-going Pain Intervention(s): RN made aware;Repositioned;Emotional support Multiple Pain Sites: Yes 2nd Pain Site Pain Score: 8 Pain Type: Surgical pain Pain Location: Back Pain Orientation: Left;Upper Pain Descriptors / Indicators: Discomfort;Burning;Grimacing Pain Onset: On-going Pain Intervention(s): Repositioned;Emotional support  See FIM for current functional status  Therapy/Group: Individual Therapy  Rich BraveLanier, Tyleah Loh Chappell 05/22/2015, 1:18 PM

## 2015-05-22 NOTE — Progress Notes (Signed)
Orthopedic Tech Progress Note Patient Details:  Steve KaysMatthew B [name] [birth year] [id number]  Ortho Devices Type of Ortho Device: Abdominal binder Ortho Device/Splint Location: abdomen Ortho Device/Splint Interventions: Freeman CaldronOrdered   [name] 05/22/2015, 5:01 PM

## 2015-05-22 NOTE — Progress Notes (Signed)
Physical Therapy Session Note  Patient Details  Name: Steve KaysMatthew B [name] MRN: [id number] Date of Birth: 1981-09-08  Today's Date: 05/22/2015 PT Individual Time: 1100-1153 PT Individual Time Calculation (min): 53 min   Short Term Goals: Week 2:  PT Short Term Goal 1 (Week 2): Pt will perform bed mobility with HOB elevated with bed rails and with use of leg loops at mod A level  PT Short Term Goal 2 (Week 2): Pt will perform functional transfers w/ LRAD at max A of single therapist PT Short Term Goal 3 (Week 2): Pt will tolerate standing x 3 mins in order to increase WB through LEs.  PT Short Term Goal 4 (Week 2): Pt will propel w/c x 50' (one arm drive chair) at min A level  PT Short Term Goal 5 (Week 2): Pt will tolerate sitting up OOB x 2 hours between therapies.    Skilled Therapeutic Interventions/Progress Updates:   Patient asleep in wheelchair upon arrival, wife at bedside. Patient agreeable to leaving room for therapy and transported total A to ortho gym. Patient performed sit <> stand from wheelchair x 2 using hemiwalker with supervision and able to tolerate standing up to 2 min 30 sec at a time. Patient primarily limited by pain in back/LUE in sitting and in pelvis in standing with no c/o lightheadedness this date.   Orthostatic vitals without TED hose: Sitting 111/63, HR 83 Standing 94/48, HR 129  Donned TED hose via LAQ BLE and reassessed vitals: Sitting BP 103/65, HR 85 Standing BP 97/66, HR 125   Patient returned to room and left tilted back in wheelchair for pain relief with pillows placed for positioning and comfort with call bell in lap and mom in room.   Therapy Documentation Precautions:  Precautions Precautions: Fall Required Braces or Orthoses: Sling Restrictions Weight Bearing Restrictions: Yes LUE Weight Bearing: Non weight bearing RLE Weight Bearing: Weight bearing as tolerated LLE Weight Bearing: Weight bearing as tolerated Pain: Pain Assessment Pain  Assessment: 0-10 Pain Score: 8  Pain Type: Acute pain Pain Location: Pelvis Pain Orientation: Left;Right Pain Descriptors / Indicators: Aching;Discomfort;Grimacing Pain Onset: On-going Pain Intervention(s): Repositioned;Emotional support 2nd Pain Site Pain Score: 8 Pain Type: Acute pain Pain Location: Back Pain Orientation: Left Pain Descriptors / Indicators: Discomfort;Grimacing;Aching Pain Onset: On-going Pain Intervention(s): Repositioned;Emotional support  See FIM for current functional status  Therapy/Group: Individual Therapy  Kerney ElbeVarner, Birdella Sippel A 05/22/2015, 12:18 PM

## 2015-05-22 NOTE — Progress Notes (Signed)
Social Work Patient ID: Hoover Browns, male   DOB: [birth year], 34 y.o.   MRN: [id number]  Met with pt and wife yesterday afternoon to review team conference.  Both aware that no change was made to targeted d/c date of 7/8, however, this date is dependent on medical clearance from Plastic Surgeon.  Pt and wife pleased with functional gains he has made.  Wife remains very involved and supportive.   Have also spoken with pt's Worker's Comp CM to begin coordinating DME and f/u referral plans.  Will continue to follow.  Delia Sitar, LCSW
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
## 2015-05-22 NOTE — Anesthesia Postprocedure Evaluation (Signed)
Anesthesia Post Note  Patient: Steve [name]  Procedure(s) Performed: Procedure(s) (LRB): DEBRIDEMENT OF LEFT UPPER EXTREMITY and TRUNK with APPLICATION OF A CELL (Left) APPLICATION OF A-CELL OF left BACK (Left) APPLICATION OF A-CELL OF Left Upper Arm (Left)  Anesthesia type: General  Patient location: PACU  Post pain: Pain level controlled  Post assessment: Post-op Vital signs reviewed  Last Vitals: BP 112/70 mmHg  Pulse 78  Temp(Src) 37.1 C (Oral)  Resp 18  Wt 230 lb 1.2 oz (104.36 kg)  SpO2 95%  Post vital signs: Reviewed  Level of consciousness: sedated  Complications: No apparent anesthesia complications
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
## 2015-05-22 NOTE — Progress Notes (Signed)
Patient given soap suds enema as ordered and effective as patient had an extra large bowel movement. Stated he felt better.

## 2015-05-23 ENCOUNTER — Inpatient Hospital Stay (HOSPITAL_COMMUNITY): Payer: Self-pay | Admitting: Physical Therapy

## 2015-05-23 ENCOUNTER — Inpatient Hospital Stay (HOSPITAL_COMMUNITY): Payer: Self-pay

## 2015-05-23 ENCOUNTER — Inpatient Hospital Stay (HOSPITAL_COMMUNITY): Payer: Worker's Compensation | Admitting: Physical Therapy

## 2015-05-23 NOTE — Progress Notes (Signed)
Orthopedic Tech Progress Note Patient Details:  Steve [name] 12-25-1980 [id number]  Ortho Devices Type of Ortho Device: Arm sling Ortho Device/Splint Location: lue Ortho Device/Splint Interventions: Application   [name] 05/23/2015, 11:39 AM

## 2015-05-23 NOTE — Progress Notes (Signed)
Orthopedic Tech Progress Note Patient Details:  Steve [name] 02-14-1981 [name]  Patient ID: Steve KaysMatthew B [name], male   DOB: 02-14-1981, 34 y.o.   MRN: [id number] Called in advanced brace order; spoke with Steve BachJulia  Steve [name] 05/23/2015, 1:05 PM

## 2015-05-23 NOTE — Progress Notes (Signed)
Occupational Therapy Weekly Progress Note  Patient Details  Name: Steve [name] MRN: [id number] Date of Birth: 05-25-1981  Beginning of progress report period: May 16, 2015 End of progress report period: May 23, 2015  Patient has met 4 of 5 short term goals.  Pt has made steady progress with BADLs since procedure on 6/28.  Pt continues to c/o significant pelvic pain but is able to consistently participate in therapy.  Pt performs SPT with hemi walker at steady A.  Pt is increasingly able to actively participate in LB bathing and dressing tasks and is mod A for bathing tasks.  Pt's wife and mother have been present for therapy sessions and discharge planning is ongoing.  Patient continues to demonstrate the following deficits: muscle weakness and acute pain , decreased cardiorespiratoy endurance and decreased sitting balance, decreased standing balance and decreased balance strategies, decreased safety and therefore will continue to benefit from skilled OT intervention to enhance overall performance with BADL and Reduce care partner burden.  Patient progressing toward long term goals..  Continue plan of care.  OT Short Term Goals Week 2:  OT Short Term Goal 1 (Week 2): Pt. will perform supine to EOB with min assist to prepare for ADLtaken  OT Short Term Goal 1 - Progress (Week 2): Met OT Short Term Goal 2 (Week 2): Pt will perform bathing with min A supine and sitting EOB OT Short Term Goal 2 - Progress (Week 2): Progressing toward goal OT Short Term Goal 3 (Week 2): Pt will performe LB dressing with mod A OT Short Term Goal 3 - Progress (Week 2): Met OT Short Term Goal 4 (Week 2): Pt will perform SPT to [facility] OT Short Term Goal 4 - Progress (Week 2): Met OT Short Term Goal 5 (Week 2): Pt will perform toileting tasks with mod A OT Short Term Goal 5 - Progress (Week 2): Met Week 3:  OT Short Term Goal 1 (Week 3): STG=LTG secondary to ELOS      Therapy Documentation Precautions:   Precautions Precautions: Fall Required Braces or Orthoses: Sling Restrictions Weight Bearing Restrictions: Yes LUE Weight Bearing: Non weight bearing RLE Weight Bearing: Weight bearing as tolerated LLE Weight Bearing: Weight bearing as tolerated  See FIM for current functional status  Therapy/Group: Individual Therapy  [name] 05/23/2015, 3:23 PM
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
## 2015-05-23 NOTE — Progress Notes (Signed)
Occupational Therapy Session Note  Patient Details  Name: Kem KaysMatthew B Gielow MRN: [id number] Date of Birth: 05-21-1981  Today's Date: 05/23/2015 OT Individual Time: 0900-1000 OT Individual Time Calculation (min): 60 min    Short Term Goals: Week 2:  OT Short Term Goal 1 (Week 2): Pt. will perform supine to EOB with min assist to prepare for ADLtaken  OT Short Term Goal 2 (Week 2): Pt will perform bathing with min A supine and sitting EOB OT Short Term Goal 3 (Week 2): Pt will performe LB dressing with mod A OT Short Term Goal 4 (Week 2): [facility] Short Term Goal 5 (Week 2): Pt will perform toileting tasks with mod A  Skilled Therapeutic Interventions/Progress Updates:    Pt engaged in BADL retraining including toilet transfers, toileting tasks, LB dressing, grooming, and bathing tasks.  Pt performed SPT with hemi walker to toilet with steady A.  Pt performed toileting tasks and LB bathing tasks with steady A using AE appropriately.  Pt stood at sink to complete UB bathing and grooming tasks. Pt requires more than reasonable amount of time to complete tasks with multiple rest breaks secondary to increased pain with standing and transitional movements.  Focus on continued activity tolerance, sit<>stand, standing balance, functional transfers, and safety awareness.  Therapy Documentation Precautions:  Precautions Precautions: Fall Required Braces or Orthoses: Sling Restrictions Weight Bearing Restrictions: Yes LUE Weight Bearing: Non weight bearing RLE Weight Bearing: Weight bearing as tolerated LLE Weight Bearing: Weight bearing as tolerated   Pain: Pain Assessment Pain Score: 0-No pain  See FIM for current functional status  Therapy/Group: Individual Therapy  Rich BraveLanier, Larita Deremer Chappell 05/23/2015, 10:05 AM

## 2015-05-23 NOTE — Progress Notes (Signed)
Physical Therapy Weekly Progress Note  Patient Details  Name: Steve [name] MRN: [id number] Date of Birth: 05-15-1981  Beginning of progress report period: May 16, 2015 End of progress report period: May 23, 2015  Today's Date: 05/23/2015 PT Individual Time: 1035-1105 and 1300-1356 PT Individual Time Calculation (min): 30 min and 56 min   Patient has met 5 of 5 short term goals.  Pt is currently min-mod A for bed mobility in hospital bed but has been able to D/C trapeze for bed mobility, min A for stand pivot transfer with hemi walker and gait x 50' with hemi-walker in controlled environment and have begun manual w/c trials.    Patient continues to demonstrate the following deficits: decreased activity tolerance/endurance, increased pain, decreased strength, balance, impaired gait and therefore will continue to benefit from skilled PT intervention to enhance overall performance with activity tolerance, balance, ability to compensate for deficits and functional use of  right lower extremity and left lower extremity.  Patient progressing toward long term goals..  Plan of care revisions: D/C stair negotiation goal, home and community w/c mobility goals, downgraded bed mobility, all transfer goal and ambulation goals to supervision overall..  PT Short Term Goals Week 2:  PT Short Term Goal 1 (Week 2): Pt will perform bed mobility with HOB elevated with bed rails and with use of leg loops at mod A level  PT Short Term Goal 1 - Progress (Week 2): Met PT Short Term Goal 2 (Week 2): Pt will perform functional transfers w/ LRAD at max A of single therapist PT Short Term Goal 2 - Progress (Week 2): Met PT Short Term Goal 3 (Week 2): Pt will tolerate standing x 3 mins in order to increase WB through LEs.  PT Short Term Goal 3 - Progress (Week 2): Met PT Short Term Goal 4 (Week 2): Pt will propel w/c x 50' (one arm drive chair) at min A level  PT Short Term Goal 4 - Progress (Week 2): Met PT Short  Term Goal 5 (Week 2): Pt will tolerate sitting up OOB x 2 hours between therapies.   PT Short Term Goal 5 - Progress (Week 2): Met Week 3:  PT Short Term Goal 1 (Week 3): = LTG for D/C  Skilled Therapeutic Interventions/Progress Updates:   Pt resting in tilt in space w/c, reporting 1/10 pain.  Pt agreeable to attempt one arm drive w/c for mobility.  Performed stand pivot to one arm drive with hemi walker and min A.  While awaiting tech to return with ELR for manual w/c; pt began to report increased pelvic pain and requested to return to tilt in space.  Pt agreeable to gait training and then perform w/c mobility this pm.  Pt transported to gym with total A.  Performed gait x 50' in controlled environment with R hemiwalker and min A with intermittent tactile cues for controlled anterior weight shift and for upright trunk.  Returned to tilt in space w/c and pt left positioned in full tilt in room to rest with all items within reach.    Discussed with pt and wife plan for home and equipment needs; pt reports he will likely stay in recliner in the home and ambulate room to room; w/c will more likely be for community.  Will continue to assess and discuss as pt progresses.    PM session: pt still resting OOB in tilt in space w/c.  Pt reporting pain but willing to participate.  Transported to gym  in tilt w/c total A.  Pt set up at stairs and demonstrated to pt use of R rail and step to sequence.  Pt elected to ascend with RLE; performed with mod A forwards to ascend one step and backwards to descend with assistance to lift LLE off of step back down to ground.  Pt unable to complete more than one step due to pain and fear of falling.  Offered to attempt again on lower step (3") and lead with LLE to minimize pushing through more painful RLE; pt reports that they will have temporary ramp set up at his house for entry/exit and pt requesting to not perform stairs again.  Returned to room and pt transferred tilt w/c >  manual one arm drive w/c stand pivot with min A and hemi walker.  Performed one arm drive w/c mobility training x 150' in controlled environment with min A and mod verbal cues for sequencing.  Returned to tilt w/c stand pivot min A and tilted for comfort while awaiting OT for final session.    Therapy Documentation Precautions:  Precautions Precautions: Fall Required Braces or Orthoses: Sling Restrictions Weight Bearing Restrictions: Yes LUE Weight Bearing: Non weight bearing RLE Weight Bearing: Weight bearing as tolerated LLE Weight Bearing: Weight bearing as tolerated Pain: Pain Assessment Pain Score: 0-No pain Locomotion : Ambulation Ambulation/Gait Assistance: 4: Min assist   See FIM for current functional status  Therapy/Group: Individual Therapy  Raylene Everts [facility] 05/23/2015, 11:56 AM

## 2015-05-23 NOTE — Plan of Care (Signed)
Problem: RH Bed Mobility Goal: LTG Patient will perform bed mobility with assist (PT) LTG: Patient will perform bed mobility with assistance, with/without cues (PT).  Downgraded 05/23/15  Problem: RH Bed to Chair Transfers Goal: LTG Patient will perform bed/chair transfers w/assist (PT) LTG: Patient will perform bed/chair transfers with assistance, with/without cues (PT).  Downgraded 05/23/15  Problem: RH Car Transfers Goal: LTG Patient will perform car transfers with assist (PT) LTG: Patient will perform car transfers with assistance (PT).  Downgraded 05/23/15     Problem: RH Ambulation Goal: LTG Patient will ambulate in controlled environment (PT) LTG: Patient will ambulate in a controlled environment, # of feet with assistance (PT).  Downgraded 05/23/15    Goal: LTG Patient will ambulate in home environment (PT) LTG: Patient will ambulate in home environment, # of feet with assistance (PT).  Downgraded 05/23/15  Problem: RH Wheelchair Mobility Goal: LTG Patient will propel w/c in home environment (PT) LTG: Patient will propel wheelchair in home environment, # of feet with assistance (PT).  Outcome: Not Applicable Date Met:  16/10/96 D/C 05/23/15 due pt will likely be ambulatory in the home Goal: LTG Patient will propel w/c in community environment (PT) LTG: Patient will propel wheelchair in community environment, # of feet with assist (PT)  Outcome: Not Applicable Date Met:  04/54/09 D/C 05/23/15; will likely require total A w/c mobility in community  Problem: RH Stairs Goal: LTG Patient will ambulate up and down stairs w/assist (PT) LTG: Patient will ambulate up and down # of stairs with assistance (PT)  Outcome: Not Applicable Date Met:  81/19/14 D/C 05/23/15 due to pain; pt will have ramp for entry/exit

## 2015-05-23 NOTE — Progress Notes (Signed)
Occupational Therapy Note  Patient Details  Name: Steve KaysMatthew B Terriquez MRN: [id number] Date of Birth: February 22, 1981  Today's Date: 05/23/2015 OT Individual Time: 1400-1430 OT Individual Time Calculation (min): 30 min   Pt c/o 7/10 pain in pelvis; RN aware and repositioned Individual Therapy  Pt resting in w/c with wife and mother present upon arrival.  Pt stated he was hurting but agreeable to participating in therapy.  Focus on sit<>stand, standing balance, and stepping to 2" elevated surface.  Pt unable to shift weight sufficiently to lift LLE onto step.  Pt attempted to raise RLE approx 1-2" but unable to place on step.  Pt was able to raise RLE X 15 before unable to continue secondary to pain.  Discussed with patient that best time to practice this activity would be at the beginning of the therapy day.  Continued discharge planning and family education with patient, wife, and mother.   [name], Daren Yeagle [facility] 05/23/2015, 3:12 PM

## 2015-05-24 ENCOUNTER — Inpatient Hospital Stay (HOSPITAL_COMMUNITY): Payer: Worker's Compensation | Admitting: Occupational Therapy

## 2015-05-24 ENCOUNTER — Inpatient Hospital Stay (HOSPITAL_COMMUNITY): Payer: Worker's Compensation | Admitting: Physical Therapy

## 2015-05-24 DIAGNOSIS — S329XXD Fracture of unspecified parts of lumbosacral spine and pelvis, subsequent encounter for fracture with routine healing: Secondary | ICD-10-CM

## 2015-05-24 DIAGNOSIS — S43005D Unspecified dislocation of left shoulder joint, subsequent encounter: Secondary | ICD-10-CM

## 2015-05-24 MED ORDER — ZOLPIDEM TARTRATE 5 MG PO TABS
10.0000 mg | ORAL_TABLET | Freq: Every evening | ORAL | Status: DC | PRN
  Administered 2015-05-24 – 2015-05-29 (×3): 10 mg via ORAL
  Filled 2015-05-24 (×4): qty 2

## 2015-05-24 NOTE — Plan of Care (Signed)
Problem: RH PAIN MANAGEMENT Goal: RH STG PAIN MANAGED AT OR BELOW PT'S PAIN GOAL <3  Outcome: Not Progressing Patient on routine pain med  Problem: RH BLADDER ELIMINATION Goal: RH STG MANAGE BLADDER WITH EQUIPMENT WITH ASSISTANCE STG Manage Bladder With Equipment With total Assistance  Outcome: Not Progressing Urinary retention on foley cath
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
## 2015-05-24 NOTE — Progress Notes (Signed)
Occupational Therapy Session Note  Patient Details  Name: Steve [name] MRN: [id number] Date of Birth: 1981-05-05  Today's Date: 05/24/2015 OT Individual Time: 0930-1030 OT Individual Time Calculation (min): 60 min    Short Term Goals: Week 3:  OT Short Term Goal 1 (Week 3): STG=LTG secondary to ELOS  Skilled Therapeutic Interventions/Progress Updates:    Engaged in ADL retraining with focus on functional mobility, activity tolerance, standing tolerance, and weight shift during functional activities.  Pt in bed upon arrival reporting not sleeping well overnight but willing to participate in treatment session.  Pt utilized hospital bed positioning to assist with supine to sit with therapist assisting with bringing BLE to EOB.  Min/steady assist sit > stand at EOB with hemi-walker.  Pt ambulated approx 8 feet from bed to sink with hemi-walker and steady assist from therapist.  Engaged in oral hygiene in standing at sink with intermittent steady assist to supervision throughout.  Pt tolerated standing ~3 mins before requiring seated rest break.  Bathing completed at sink with assist in standing to wash buttocks and perineal area due to pain in back with mobility.  Pt donned shirt over head and left over sling by choice, educated on hemi-dressing technique with removing sling and donning LUE first if wanting to don shirt fully.  Ambulated approx 10 feet with 2 turns with hemi-walker and min/steady assist to address weight shift.  Pt left reclined in tilt-in-space w/c to rest for next therapy session.  Wife present throughout session and assisting as needed.  Therapy Documentation Precautions:  Precautions Precautions: Fall Required Braces or Orthoses: Sling Restrictions Weight Bearing Restrictions: Yes LUE Weight Bearing: Non weight bearing RLE Weight Bearing: Weight bearing as tolerated LLE Weight Bearing: Weight bearing as tolerated General:   Vital Signs: Therapy Vitals Pulse Rate:  89 BP: 104/60 mmHg Patient Position (if appropriate): Sitting Pain:  Pt reports pain 4/10 in back with mobility.  Provided rest breaks and left in tilt-in-space w/c at end of session.  See FIM for current functional status  Therapy/Group: Individual Therapy  Rosalio LoudHOXIE, Lashann Hagg 05/24/2015, 11:28 AM

## 2015-05-24 NOTE — Progress Notes (Signed)
Physical Therapy Session Note  Patient Details  Name: Kem KaysMatthew B Harps MRN: [id number] Date of Birth: [birth year]  Today's Date: 05/24/2015 PT Individual Time: 1100-1200 PT Individual Time Calculation (min): 60 min   Short Term Goals: Week 1:  PT Short Term Goal 1 (Week 1): Pt will increase supine to edge of bed, edge of bed to supine with siderail to min A.  PT Short Term Goal 1 - Progress (Week 1): Progressing toward goal PT Short Term Goal 2 (Week 1): Pt will increase transfers bed to chair, chair to bed with LRAD to min A.  PT Short Term Goal 2 - Progress (Week 1): Progressing toward goal PT Short Term Goal 3 (Week 1): Pt will ambulate with LRAD about 25 feet with min A.  PT Short Term Goal 3 - Progress (Week 1): Progressing toward goal PT Short Term Goal 4 (Week 1): Pt will ascend/descend 2 stairs with 1 rail and min A.  PT Short Term Goal 4 - Progress (Week 1): Not progressing PT Short Term Goal 5 (Week 1): Pt will propel w/c about 50 feet with S.  PT Short Term Goal 5 - Progress (Week 1): Not progressing  Skilled Therapeutic Interventions/Progress Updates:  Pt was seen bedside in the am. Pt performed multiple sit to stand transfers with hemiwalker and min guard. Pt ambulated with hemiwalker and S to min guard, 77 feet x 2 with verbal cues. Pt performed sit to stand transfers 5 reps x 2 with hemiwalker and S. Pt returned to room and left sitting up in w/c with call bell within reach.   Therapy Documentation Precautions:  Precautions Precautions: Fall Required Braces or Orthoses: Sling Restrictions Weight Bearing Restrictions: Yes LUE Weight Bearing: Non weight bearing RLE Weight Bearing: Weight bearing as tolerated LLE Weight Bearing: Weight bearing as tolerated General:   Pain: No c/o pain at rest.   Locomotion : Ambulation Ambulation/Gait Assistance: 4: Min guard;5: Supervision   See FIM for current functional status  Therapy/Group: Individual Therapy  Rayford HalstedMitchell,  Robey Massmann G 05/24/2015, 12:51 PM

## 2015-05-24 NOTE — Progress Notes (Signed)
Occupational Therapy Session Note  Patient Details  Name: Steve [name] MRN: [id number] Date of Birth: Jul 08, 1981  Today's Date: 05/24/2015 OT Individual Time: 1430-1500 OT Individual Time Calculation (min): 30 min    Short Term Goals: Week 2:  OT Short Term Goal 1 (Week 2): Pt. will perform supine to EOB with min assist to prepare for ADLtaken  OT Short Term Goal 1 - Progress (Week 2): Met OT Short Term Goal 2 (Week 2): Pt will perform bathing with min A supine and sitting EOB OT Short Term Goal 2 - Progress (Week 2): Progressing toward goal OT Short Term Goal 3 (Week 2): Pt will performe LB dressing with mod A OT Short Term Goal 3 - Progress (Week 2): Met OT Short Term Goal 4 (Week 2): Pt will perform SPT to [facility] OT Short Term Goal 4 - Progress (Week 2): Met OT Short Term Goal 5 (Week 2): Pt will perform toileting tasks with mod A OT Short Term Goal 5 - Progress (Week 2): Met  Skilled Therapeutic Interventions/Progress Updates: Though patient c/o pelvic & back pain and buring on left back and side, he attempted skilled OT to increase his overall mobility and function for self care and IADLs.   He was unable to stand at hemi walker and weight bear through Right LE in order to tap Left LE onto 2" step.   He stated it was too painful and that he did not have the LE strength to do so.  He was uanble to do so with the opposite LE as well.   He was able to sit EOB and complete the aforementioned task.   Otherwise, he returned demonstration for completing various pelvic motility within his pain tolerance.   He requred assist from this clinician to lift both legs off bed and onto floor and then off floor and onto bed.   Pain and burning inhibiting to his sessions per patient complaints.  His mother was present and supportive for the session.     Therapy Documentation Precautions:  Precautions Precautions: Fall Required Braces or Orthoses: Sling Restrictions Weight Bearing Restrictions:  Yes LUE Weight Bearing: Non weight bearing RLE Weight Bearing: Weight bearing as tolerated LLE Weight Bearing: Weight bearing as tolerated  Pain:10+/10 in pelvis and back upon all movements.   Pain meds already given    See FIM for current functional status  Therapy/Group: Individual Therapy  [name] [facility] 05/24/2015, 4:30 PM

## 2015-05-25 ENCOUNTER — Inpatient Hospital Stay (HOSPITAL_COMMUNITY): Payer: Self-pay

## 2015-05-25 ENCOUNTER — Inpatient Hospital Stay (HOSPITAL_COMMUNITY): Payer: Worker's Compensation | Admitting: Physical Therapy
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
## 2015-05-25 NOTE — Progress Notes (Signed)
Patient reports, not sleeping well. Paged Dr. Amador CunasKwiatkowski, orders received. Ambien 10mg  given at 2312. At 0045, complained of itching all over, no rash observed. PRN benadryl 25mg  given. Has rested quietly since 0130. Unable to turn, due to pain, per patient. Alfredo MartinezMurray, Amorina Doerr A

## 2015-05-25 NOTE — Progress Notes (Signed)
05/25/15 1512 nsg RN was called to room re: foley catheter pt c/o soreness and pain. Noted redness at site of insertion. Explained to patient the pros and cons of foley catheter and later said to take it off and he will try to void on his own.

## 2015-05-25 NOTE — Progress Notes (Signed)
Physical Therapy Session Note  Patient Details  Name: Steve [name] MRN: [id number] Date of Birth: 1981/06/12  Today's Date: 05/25/2015 PT Individual Time: 1000-1100 PT Individual Time Calculation (min): 60 min   Short Term Goals: Week 3:  PT Short Term Goal 1 (Week 3): = LTG for D/C  Skilled Therapeutic Interventions/Progress Updates:   Session focused on functional transfers, ambulation, and standing tolerance. Patient received resting in bed, c/o feeling lightheaded after sitting edge of bed for about 15 min earlier and need to have BM. Seated BP 107/59, HR 85, 95% Sp02. Patient transferred to edge of bed with HOB elevated and mod A to bring BLE off edge of bed. Patient performed sit <> stand transfers from edge of bed and from wheelchair using HW with supervision. Gait using HW from edge of bed out of room x 42 ft + 45 ft with close supervision with seated rest in tilted wheelchair for pain relief. In day room at raised table, patient maintained standing for one game of Connect 4 x approx 5 minutes. Patient c/o increased L hip pain 5/10 during standing and with stand > sit after prolonged standing. Continued discussion regarding DME needs with patient and wife who reports ramp should be installed this week and trial use of reclining wheelchair next week. Patient returned to bed at end of session with supervision using HW at patient's request and left semi reclined in bed with all needs within reach and wife present.   Therapy Documentation Precautions:  Precautions Precautions: Fall Required Braces or Orthoses: Sling Restrictions Weight Bearing Restrictions: Yes LUE Weight Bearing: Non weight bearing RLE Weight Bearing: Weight bearing as tolerated LLE Weight Bearing: Weight bearing as tolerated Locomotion : Ambulation Ambulation/Gait Assistance: 5: Supervision   See FIM for current functional status  Therapy/Group: Individual Therapy  Kerney ElbeVarner, [name] A 05/25/2015, 12:13 PM

## 2015-05-26 ENCOUNTER — Inpatient Hospital Stay (HOSPITAL_COMMUNITY): Payer: Worker's Compensation

## 2015-05-26 ENCOUNTER — Inpatient Hospital Stay (HOSPITAL_COMMUNITY): Payer: Self-pay

## 2015-05-26 LAB — URINALYSIS, ROUTINE W REFLEX MICROSCOPIC
Bilirubin Urine: NEGATIVE
GLUCOSE, UA: NEGATIVE mg/dL
KETONES UR: NEGATIVE mg/dL
Nitrite: POSITIVE — AB
PH: 6.5 (ref 5.0–8.0)
Protein, ur: NEGATIVE mg/dL
SPECIFIC GRAVITY, URINE: 1.009 (ref 1.005–1.030)
Urobilinogen, UA: 0.2 mg/dL (ref 0.0–1.0)

## 2015-05-26 LAB — URINE MICROSCOPIC-ADD ON
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
## 2015-05-26 NOTE — Progress Notes (Signed)
Patient refused to soap feet even with encouragement from Clinical research associatewriter. "I just got it done 45 minutes to an hour ago. Plus, I need all the rest I can get for my surgery tomorrow. Let's skip tonight, and start tomorrow."

## 2015-05-26 NOTE — Progress Notes (Signed)
POD # 6 debridement of Left UE, back  Temp:  [98 F (36.7 C)-98.4 F (36.9 C)] 98.4 F (36.9 C) (07/04 0510) Pulse Rate:  [68-80] 68 (07/04 0510) Resp:  [18] 18 (07/04 0510) BP: (116-124)/(65-68) 116/65 mmHg (07/04 0510) SpO2:  [95 %-100 %] 95 % (07/04 0510)     Exam deferred, dressings in place. Per wife and patient tolerating dressing changes well, reviewed wife's picture of yesterday dressing change and states she is doing a lot of it/  Notes took 3 days to recover from anesthesia, nervous about more surgery.  Reviewed plan change all dressings, adaptic over A Cell and perform any further debridement needed, possible repeat application A Cell. Counseled that ultimately his decision for grafting, goal would be to get to end point of wound healing faster, limit drainage. He has progressed a lot with mobility and pain and discussed options for grafting to schedule as outpatient or overnight stay with home VAC. I would recommend given his significant pain that we plan removal VAC and first dressing change to donor site under sedation in OR as well a week later. Will discuss more after exam tomorrow.   Wife had questions regarding possible surgery for shoulder. Counseled could ask Rehab team to ask Orthopaedics to see patient prior to discharge for follow up. If surgery needed, I suspect Orthopaedics would want wounds healed.  15 minutes spent with patient, over half in counseling  Glenna FellowsBrinda Neale Marzette, MD [facility] Plastic & Reconstructive Surgery [phone]

## 2015-05-26 NOTE — Progress Notes (Signed)
At 2230 ambulated to bathroom with hemi-walker and voided 600cc's in urinal, PVR=0. At 2112, PRN robaxin given for spasms, denied pain. At 2305, requesting ambien and benadryl for itching. No rash observed, but complains of itching all over. At 0545 ambulated to bathroom and voided, unmeasured amount in toilet, PVR=0.  Patient discouraged, when starting to sit on toilet, starts voiding incontinent on shorts.Alfredo MartinezMurray, Heran Campau A

## 2015-05-26 NOTE — Progress Notes (Signed)
Occupational Therapy Session Note  Patient Details  Name: Steve [name] MRN: [id number] Date of Birth: 05-26-1981  Today's Date: 05/26/2015 OT Individual Time: 0903-1000 and 1400-1500 OT Individual Time Calculation (min): 57 min and 60 min    Short Term Goals: Week 3:  OT Short Term Goal 1 (Week 3): STG=LTG secondary to ELOS  Skilled Therapeutic Interventions/Progress Updates:    Session 1: Pt seen for 1:1 OT session with a focus on functional transfers, functional mobility, weight shift, and activity tolerance. Pt received supine in bed with his wife present. Pt stated he was in pain but agreeable to participate in therapy. Pt completed bed mobility with hospital bed assist and mod A for BLE management to EOB. Pt completed transfer from EOB>w/c with min A with use of hemi-walker. Pt engaged in oral care seated in w/c. Pt engaged in 4 sets of 5 toe touches on elevated 2in surface. Pt demonstrated improved ability to weight-shift onto R side to lift LLE onto step. Pt required intermittent rest breaks during activity due to pain level and decreased activity tolerance. Therapist discussed with pt home entry and equipment to be provided at d/c home. Pt left reclined in w/c awaiting PT arrival. Wife present throughout session and provided assist to pt when needed.    Session 2: Pt seen for 1:1 OT session with a focus on functional transfers, functional mobility, dynamic standing balance, activity tolerance, and weight shift. Pt received supine in bed asleep agreeable to participate in therapy. Pt completed bed mobility with assistance from hospital bed/UE and mod A from therapist for BLE management. Pt requesting to toilet once seated EOB. Therapist checked off wife for functional transfers in room and wife assisted pt to bathroom via hemiwalker and SBA. Pt then ambulated back to w/c to go outside. Pt completed dynamic standing balance activity with assist from hemiwalker. Pt encouraged to weight-shift  onto R side via beanbag placement and completed the activity in standing for 3-4 min 2x. Pt then engaged in functional mobility on uneven surfaces outside with steadying A and hemiwalker for apprx. 15 ft. Pt then transferred back to w/c and propelled back to room. Pt completed transfer from w/c>EOB with steadying A. Pt completed bed mobility with mod A for BLE management. Pt left supine in bed with wife and mother present and all other needs within reach.   Therapy Documentation Precautions:  Precautions Precautions: Fall Required Braces or Orthoses: Sling Restrictions Weight Bearing Restrictions: Yes LUE Weight Bearing: Non weight bearing RLE Weight Bearing: Weight bearing as tolerated LLE Weight Bearing: Weight bearing as tolerated General:   Vital Signs:   Pain: Pain Assessment Pain Score: 2  ADL:   Exercises:   Other Treatments:    See FIM for current functional status  Therapy/Group: Individual Therapy  Alger Memosmily Bryley Kovacevic 05/26/2015, 12:40 PM

## 2015-05-27 ENCOUNTER — Inpatient Hospital Stay (HOSPITAL_COMMUNITY): Payer: Self-pay

## 2015-05-27 ENCOUNTER — Inpatient Hospital Stay (HOSPITAL_COMMUNITY): Admission: RE | Admit: 2015-05-27 | Payer: Worker's Compensation | Source: Ambulatory Visit | Admitting: Plastic Surgery

## 2015-05-27 ENCOUNTER — Encounter (HOSPITAL_COMMUNITY): Payer: Self-pay | Admitting: Anesthesiology

## 2015-05-27 ENCOUNTER — Encounter (HOSPITAL_COMMUNITY)
Admission: AD | Disposition: A | Discharge status: Home Health | Payer: Self-pay | Source: Intra-hospital | Attending: Physical Medicine & Rehabilitation

## 2015-05-27 ENCOUNTER — Inpatient Hospital Stay (HOSPITAL_COMMUNITY): Payer: Self-pay | Admitting: Occupational Therapy

## 2015-05-27 ENCOUNTER — Inpatient Hospital Stay (HOSPITAL_COMMUNITY): Payer: Worker's Compensation | Admitting: Anesthesiology

## 2015-05-27 ENCOUNTER — Inpatient Hospital Stay (HOSPITAL_COMMUNITY): Payer: Worker's Compensation | Admitting: Physical Therapy

## 2015-05-27 HISTORY — PX: APPLICATION OF A-CELL OF EXTREMITY: SHX6303

## 2015-05-27 SURGERY — APPLICATION OF A-CELL OF EXTREMITY
Anesthesia: General | Site: Back | Laterality: Left

## 2015-05-27 MED ORDER — ROCURONIUM BROMIDE 100 MG/10ML IV SOLN
INTRAVENOUS | Status: DC | PRN
  Administered 2015-05-27: 50 mg via INTRAVENOUS

## 2015-05-27 MED ORDER — LIDOCAINE HCL (CARDIAC) 20 MG/ML IV SOLN
INTRAVENOUS | Status: AC
  Filled 2015-05-27: qty 5

## 2015-05-27 MED ORDER — ONDANSETRON HCL 4 MG/2ML IJ SOLN
INTRAMUSCULAR | Status: DC | PRN
  Administered 2015-05-27: 4 mg via INTRAVENOUS

## 2015-05-27 MED ORDER — NEOSTIGMINE METHYLSULFATE 10 MG/10ML IV SOLN
INTRAVENOUS | Status: DC | PRN
Start: 2015-05-27 — End: 2015-05-27
  Administered 2015-05-27: 3 mg via INTRAVENOUS

## 2015-05-27 MED ORDER — FENTANYL CITRATE (PF) 100 MCG/2ML IJ SOLN
INTRAMUSCULAR | Status: DC | PRN
  Administered 2015-05-27 (×3): 50 ug via INTRAVENOUS
  Administered 2015-05-27: 100 ug via INTRAVENOUS
  Administered 2015-05-27 (×4): 50 ug via INTRAVENOUS

## 2015-05-27 MED ORDER — OXYCODONE HCL 5 MG/5ML PO SOLN: 5.0000 mg | Freq: Once | ORAL | Status: DC | PRN

## 2015-05-27 MED ORDER — NEOSTIGMINE METHYLSULFATE 10 MG/10ML IV SOLN
INTRAVENOUS | Status: AC
  Filled 2015-05-27: qty 1

## 2015-05-27 MED ORDER — CEFAZOLIN SODIUM-DEXTROSE 2-3 GM-% IV SOLR
INTRAVENOUS | Status: DC | PRN
  Administered 2015-05-27: 2 g via INTRAVENOUS

## 2015-05-27 MED ORDER — MIDAZOLAM HCL 5 MG/5ML IJ SOLN
INTRAMUSCULAR | Status: DC | PRN
  Administered 2015-05-27: 2 mg via INTRAVENOUS

## 2015-05-27 MED ORDER — GLYCOPYRROLATE 0.2 MG/ML IJ SOLN
INTRAMUSCULAR | Status: DC | PRN
  Administered 2015-05-27: 0.4 mg via INTRAVENOUS

## 2015-05-27 MED ORDER — SODIUM CHLORIDE 0.9 % IJ SOLN
INTRAMUSCULAR | Status: AC
  Filled 2015-05-27: qty 10

## 2015-05-27 MED ORDER — KETAMINE HCL 10 MG/ML IJ SOLN
INTRAMUSCULAR | Status: DC | PRN
  Administered 2015-05-27: 20 mg via INTRAVENOUS

## 2015-05-27 MED ORDER — CEFAZOLIN SODIUM-DEXTROSE 2-3 GM-% IV SOLR
INTRAVENOUS | Status: AC
  Filled 2015-05-27: qty 50

## 2015-05-27 MED ORDER — FENTANYL CITRATE (PF) 250 MCG/5ML IJ SOLN
INTRAMUSCULAR | Status: AC
  Filled 2015-05-27: qty 5

## 2015-05-27 MED ORDER — DEXMEDETOMIDINE BOLUS VIA INFUSION
INTRAVENOUS | Status: DC | PRN
  Administered 2015-05-27: 4 ug via INTRAVENOUS
  Administered 2015-05-27 (×3): 12 ug via INTRAVENOUS

## 2015-05-27 MED ORDER — ONDANSETRON HCL 4 MG/2ML IJ SOLN: 4.0000 mg | Freq: Once | INTRAMUSCULAR | Status: DC | PRN

## 2015-05-27 MED ORDER — LACTATED RINGERS IV SOLN
INTRAVENOUS | Status: DC | PRN
  Administered 2015-05-27 (×2): via INTRAVENOUS

## 2015-05-27 MED ORDER — HYDROMORPHONE HCL 1 MG/ML IJ SOLN
INTRAMUSCULAR | Status: AC
  Filled 2015-05-27: qty 1

## 2015-05-27 MED ORDER — LACTATED RINGERS IV SOLN
INTRAVENOUS | Status: DC
  Administered 2015-05-27: 11:00:00 via INTRAVENOUS

## 2015-05-27 MED ORDER — FENTANYL CITRATE (PF) 100 MCG/2ML IJ SOLN
25.0000 ug | INTRAMUSCULAR | Status: DC | PRN
  Administered 2015-05-27: 50 ug via INTRAVENOUS

## 2015-05-27 MED ORDER — GLYCOPYRROLATE 0.2 MG/ML IJ SOLN
INTRAMUSCULAR | Status: AC
  Filled 2015-05-27: qty 3

## 2015-05-27 MED ORDER — OXYCODONE HCL 5 MG PO TABS: 5.0000 mg | ORAL_TABLET | Freq: Once | ORAL | Status: DC | PRN

## 2015-05-27 MED ORDER — FENTANYL CITRATE (PF) 100 MCG/2ML IJ SOLN
INTRAMUSCULAR | Status: AC
  Filled 2015-05-27: qty 2

## 2015-05-27 MED ORDER — KETAMINE HCL 100 MG/ML IJ SOLN
INTRAMUSCULAR | Status: AC
  Filled 2015-05-27: qty 1

## 2015-05-27 MED ORDER — MIDAZOLAM HCL 2 MG/2ML IJ SOLN
INTRAMUSCULAR | Status: AC
  Filled 2015-05-27: qty 2

## 2015-05-27 MED ORDER — PROPOFOL 10 MG/ML IV BOLUS
INTRAVENOUS | Status: AC
  Filled 2015-05-27: qty 20

## 2015-05-27 MED ORDER — ARTIFICIAL TEARS OP OINT
TOPICAL_OINTMENT | OPHTHALMIC | Status: DC | PRN
  Administered 2015-05-27: 1 via OPHTHALMIC

## 2015-05-27 MED ORDER — LIDOCAINE HCL (CARDIAC) 20 MG/ML IV SOLN
INTRAVENOUS | Status: DC | PRN
  Administered 2015-05-27: 50 mg via INTRAVENOUS

## 2015-05-27 MED ORDER — PROPOFOL 10 MG/ML IV BOLUS
INTRAVENOUS | Status: DC | PRN
  Administered 2015-05-27: 170 mg via INTRAVENOUS

## 2015-05-27 MED ORDER — DEXMEDETOMIDINE HCL IN NACL 200 MCG/50ML IV SOLN
INTRAVENOUS | Status: AC
  Filled 2015-05-27: qty 50

## 2015-05-27 MED ORDER — 0.9 % SODIUM CHLORIDE (POUR BTL) OPTIME
TOPICAL | Status: DC | PRN
  Administered 2015-05-27: 1000 mL

## 2015-05-27 SURGICAL SUPPLY — 56 items
BAG DECANTER FOR FLEXI CONT (MISCELLANEOUS) IMPLANT
BANDAGE ELASTIC 3 VELCRO ST LF (GAUZE/BANDAGES/DRESSINGS) IMPLANT
BANDAGE ELASTIC 4 VELCRO ST LF (GAUZE/BANDAGES/DRESSINGS) IMPLANT
BANDAGE ELASTIC 6 VELCRO ST LF (GAUZE/BANDAGES/DRESSINGS) IMPLANT
BINDER BREAST XLRG (GAUZE/BANDAGES/DRESSINGS) ×3 IMPLANT
BLADE SURG 10 STRL SS (BLADE) ×3 IMPLANT
BLADE SURG 15 STRL LF DISP TIS (BLADE) ×1 IMPLANT
BLADE SURG 15 STRL SS (BLADE) ×2
BNDG COHESIVE 4X5 TAN STRL (GAUZE/BANDAGES/DRESSINGS) ×3 IMPLANT
BNDG GAUZE ELAST 4 BULKY (GAUZE/BANDAGES/DRESSINGS) ×3 IMPLANT
BURN MATRIX MATRISTEM 10X15 (Tissue) ×9 IMPLANT
BURN MATRIX MATRISTEM 7X10 (Orthopedic Implant) ×3 IMPLANT
CANISTER SUCTION 2500CC (MISCELLANEOUS) IMPLANT
DRAPE EXTREMITY T 121X128X90 (DRAPE) IMPLANT
DRAPE INCISE IOBAN 66X45 STRL (DRAPES) IMPLANT
DRAPE ORTHO SPLIT 77X108 STRL (DRAPES) ×4
DRAPE SURG ORHT 6 SPLT 77X108 (DRAPES) ×2 IMPLANT
DRSG ADAPTIC 3X8 NADH LF (GAUZE/BANDAGES/DRESSINGS) ×6 IMPLANT
DRSG PAD ABDOMINAL 8X10 ST (GAUZE/BANDAGES/DRESSINGS) ×3 IMPLANT
ELECT COATED BLADE 2.86 ST (ELECTRODE) ×3 IMPLANT
ELECT REM PT RETURN 9FT ADLT (ELECTROSURGICAL) ×3
ELECTRODE REM PT RTRN 9FT ADLT (ELECTROSURGICAL) ×1 IMPLANT
GLOVE BIO SURGEON STRL SZ 6 (GLOVE) ×3 IMPLANT
GLOVE BIO SURGEON STRL SZ7.5 (GLOVE) ×3 IMPLANT
GLOVE BIOGEL PI IND STRL 8 (GLOVE) ×1 IMPLANT
GLOVE BIOGEL PI INDICATOR 8 (GLOVE) ×2
GLOVE SURG SS PI 7.0 STRL IVOR (GLOVE) ×3 IMPLANT
GOWN STRL REUS W/ TWL LRG LVL3 (GOWN DISPOSABLE) ×2 IMPLANT
GOWN STRL REUS W/TWL LRG LVL3 (GOWN DISPOSABLE) ×4
KIT BASIN OR (CUSTOM PROCEDURE TRAY) ×3 IMPLANT
LIQUID BAND (GAUZE/BANDAGES/DRESSINGS) ×6 IMPLANT
MARKER SKIN DUAL TIP RULER LAB (MISCELLANEOUS) ×3 IMPLANT
NS IRRIG 1000ML POUR BTL (IV SOLUTION) ×3 IMPLANT
PACK SURGICAL SETUP 50X90 (CUSTOM PROCEDURE TRAY) ×3 IMPLANT
PAD CAST 3X4 CTTN HI CHSV (CAST SUPPLIES) IMPLANT
PAD CAST 4YDX4 CTTN HI CHSV (CAST SUPPLIES) IMPLANT
PADDING CAST COTTON 3X4 STRL (CAST SUPPLIES)
PADDING CAST COTTON 4X4 STRL (CAST SUPPLIES)
PENCIL BUTTON HOLSTER BLD 10FT (ELECTRODE) ×3 IMPLANT
SPONGE LAP 18X18 X RAY DECT (DISPOSABLE) ×6 IMPLANT
STAPLER VISISTAT 35W (STAPLE) ×3 IMPLANT
STOCKINETTE IMPERVIOUS 9X36 MD (GAUZE/BANDAGES/DRESSINGS) ×3 IMPLANT
SURGILUBE 2OZ TUBE FLIPTOP (MISCELLANEOUS) IMPLANT
SUT MNCRL AB 4-0 PS2 18 (SUTURE) IMPLANT
SUT VIC AB 3-0 FS2 27 (SUTURE) IMPLANT
SUT VIC AB 4-0 PS2 27 (SUTURE) ×12 IMPLANT
SUT VIC AB 5-0 PS2 18 (SUTURE) IMPLANT
SUT VICRYL 4-0 PS2 18IN ABS (SUTURE) ×6 IMPLANT
SYR BULB IRRIGATION 50ML (SYRINGE) ×3 IMPLANT
SYR CONTROL 10ML LL (SYRINGE) IMPLANT
TAPE CLOTH SURG 4X10 WHT LF (GAUZE/BANDAGES/DRESSINGS) ×3 IMPLANT
TOWEL OR 17X24 6PK STRL BLUE (TOWEL DISPOSABLE) ×3 IMPLANT
TUBE CONNECTING 12'X1/4 (SUCTIONS) ×1
TUBE CONNECTING 12X1/4 (SUCTIONS) ×2 IMPLANT
UNDERPAD 30X30 INCONTINENT (UNDERPADS AND DIAPERS) IMPLANT
YANKAUER SUCT BULB TIP NO VENT (SUCTIONS) ×3 IMPLANT

## 2015-05-27 NOTE — Progress Notes (Signed)
Physical Therapy Session Note  Patient Details  Name: Steve KaysMatthew B Langer MRN: [id number] Date of Birth: [birth year]  Today's Date: 05/27/2015 PT Individual Time: 1000-1045 PT Individual Time Calculation (min): 45 min   Short Term Goals: Week 3:  PT Short Term Goal 1 (Week 3): = LTG for D/C  Skilled Therapeutic Interventions/Progress Updates:   Session focused on progression of functional ambulation, sitting/standing tolerance, and family training with wife. Patient sitting edge of bed with wife and family members present, agreeable to therapy while waiting for transport to OR. Patient stood from edge of bed and ambulated using hemiwalker x 165 ft with supervision and wife following with wheelchair from room to therapy gym. Patient trialled use of LBQC x 25 ft + 30 ft with close supervision, improved upright posture noted due to higher height of LBQC (HW at highest setting and still too short causing patient to maintain forward flexed posture at trunk). 10 MWT using LBQC =  0.11 m/s. Patient requested to return to room and performed stand pivot [facility] to bed with supervision. Patient transferred sit > supine with HOB elevated and wife providing mod A for BLE management. Patient utilized hospital bed functions with wife stabilizing B feet while patient performed bridging to reposition pad and scoot up in bed. Wife cleared to assist patient in room. Patient demonstrated improved sitting tolerance edge of bed and in upright wheelchair this session with decreased c/o discomfort. Patient left semi reclined in bed as transport arriving to take patient to surgery.   Therapy Documentation Precautions:  Precautions Precautions: Fall Required Braces or Orthoses: Sling Restrictions Weight Bearing Restrictions: Yes LUE Weight Bearing: Non weight bearing RLE Weight Bearing: Weight bearing as tolerated LLE Weight Bearing: Weight bearing as tolerated General: PT Amount of Missed Time  (min): 15 Minutes PT Missed Treatment Reason: Unavailable (Comment) (transport to surgery) Pain: Pain Assessment Pain Assessment: 0-10 Pain Score: 5  Pain Type: Acute pain Pain Location: Pelvis Pain Orientation: Right;Left Pain Descriptors / Indicators: Aching Pain Onset: On-going Patients Stated Pain Goal: 6 Pain Intervention(s): Other (Comment) (premedicated)  See FIM for current functional status  Therapy/Group: Individual Therapy  Kerney ElbeVarner, Alaria Oconnor A 05/27/2015, 10:53 AM

## 2015-05-27 NOTE — Anesthesia Preprocedure Evaluation (Addendum)
Anesthesia Evaluation  Patient identified by MRN, date of birth, ID band Patient awake    Reviewed: Allergy & Precautions, NPO status , Patient's Chart, lab work & pertinent test results  Airway Mallampati: II  TM Distance: >3 FB Neck ROM: Full    Dental  (+) Teeth Intact, Poor Dentition   Pulmonary Current Smoker,  breath sounds clear to auscultation        Cardiovascular Rhythm:Regular Rate:Normal     Neuro/Psych    GI/Hepatic   Endo/Other    Renal/GU      Musculoskeletal   Abdominal   Peds  Hematology   Anesthesia Other Findings   Reproductive/Obstetrics                           Anesthesia Physical Anesthesia Plan  ASA: II  Anesthesia Plan: General   Post-op Pain Management:    Induction: Intravenous  Airway Management Planned: Oral ETT  Additional Equipment:   Intra-op Plan:   Post-operative Plan: Extubation in OR  Informed Consent: I have reviewed the patients History and Physical, chart, labs and discussed the procedure including the risks, benefits and alternatives for the proposed anesthesia with the patient or authorized representative who has indicated his/her understanding and acceptance.   Dental advisory given  Plan Discussed with: CRNA, Anesthesiologist and Surgeon  Anesthesia Plan Comments:       Anesthesia Quick Evaluation

## 2015-05-27 NOTE — Progress Notes (Signed)
Occupational Therapy Note  Patient Details  Name: Steve KaysMatthew B Avina MRN: [id number] Date of Birth: 04-Nov-1981  Today's Date: 05/27/2015 OT Missed Time: 60 Minutes Missed Time Reason: Other (comment) (awaiting transport to OR for procedure)  Pt missed 60 mins skilled OT services.  Pt resting in bed upon arrival, having just returned to bed after receiving pain meds in preparation for transport to OR for procedure.    [name], Romanita Fager [facility] 05/27/2015, 9:49 AM

## 2015-05-27 NOTE — Anesthesia Procedure Notes (Signed)
Procedure Name: Intubation Date/Time: 05/27/2015 11:45 AM Performed by: Leonel Ramsay'LAUGHLIN, Kalifa Cadden H Pre-anesthesia Checklist: Patient identified, Timeout performed, Emergency Drugs available, Suction available and Patient being monitored Patient Re-evaluated:Patient Re-evaluated prior to inductionOxygen Delivery Method: Circle system utilized Preoxygenation: Pre-oxygenation with 100% oxygen Intubation Type: IV induction Ventilation: Mask ventilation without difficulty and Oral airway inserted - appropriate to patient size Laryngoscope Size: Mac and 4 Grade View: Grade I Tube type: Oral Tube size: 7.5 mm Number of attempts: 1 Airway Equipment and Method: Stylet and Oral airway Placement Confirmation: ETT inserted through vocal cords under direct vision,  positive ETCO2 and breath sounds checked- equal and bilateral Secured at: 24 cm Tube secured with: Tape Dental Injury: Teeth and Oropharynx as per pre-operative assessment

## 2015-05-27 NOTE — Progress Notes (Addendum)
Orthopedic Tech Progress Note Patient Details:  Kem KaysMatthew B Cullom [birth year] [id number]  Ortho Devices Type of Ortho Device: Arm sling Ortho Device/Splint Location: LUE Ortho Device/Splint Interventions: Ordered, Application   Jennye MoccasinHughes, Aura Bibby Craig 05/27/2015, 5:53 PM Replacement arm sling.
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
## 2015-05-27 NOTE — Progress Notes (Signed)
Occupational Therapy Note  Patient Details  Name: Kem KaysMatthew B Galbreath MRN: [id number] Date of Birth: 07/29/1981  Today's Date: 05/27/2015 OT Missed Time: 60 Minutes Missed Time Reason: Other (comment) (awaiting transport to OR for procedure)  Pt missed 60 mins skilled OT services. Pt resting in bed upon arrival, having just returned to bed after receiving pain meds in preparation for transport to OR for procedure.   [name], Wilburt Messina [facility] Center[facility]appell 05/27/2015
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
## 2015-05-27 NOTE — Progress Notes (Signed)
Social Work Patient ID: Steve [name], male   DOB: 1981/03/27, 34 y.o.   MRN: [id number]   Have reviewed team conference discussion with pt's wife (pt asleep following debridement).  Wife aware that team might consider d/c earlier, however, need to confirm/ order/ deliver DME and make all d/c arrangements.   Tx wants to see how he is able to perform tomorrow to make further decisions about d/c.   Lacoya Wilbanks, LCSW
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
## 2015-05-27 NOTE — Progress Notes (Signed)
Occupational Therapy Note  Patient Details  Name: Kem KaysMatthew B Glaab MRN: [id number] Date of Birth: 31-Mar-1981  Today's Date: 05/27/2015 OT Missed Time: 30 Minutes Missed Time Reason: Other (comment);Unavailable (comment) (Pt returning from debridement surgery)  Pt in supine upon arrival having just returned from debridement surgery. Pt voicing 10/10 pain and highly medicated following procedure. Pt left in supine with all needs in reach, nursing staff present.    Lewis, Sary Bogie C 05/27/2015, 3:07 PM

## 2015-05-27 NOTE — Progress Notes (Signed)
Down to OR vis stretcher. Report called [id number] and pt sling removed with mastectomy girdle before sending.Pamelia HoitSharp, Shanine Kreiger B

## 2015-05-27 NOTE — H&P (View-Only) (Signed)
POD # 6 debridement of Left UE, back  Temp:  [98 F (36.7 C)-98.4 F (36.9 C)] 98.4 F (36.9 C) (07/04 0510) Pulse Rate:  [68-80] 68 (07/04 0510) Resp:  [18] 18 (07/04 0510) BP: (116-124)/(65-68) 116/65 mmHg (07/04 0510) SpO2:  [95 %-100 %] 95 % (07/04 0510)     Exam deferred, dressings in place. Per wife and patient tolerating dressing changes well, reviewed wife's picture of yesterday dressing change and states she is doing a lot of it/  Notes took 3 days to recover from anesthesia, nervous about more surgery.  Reviewed plan change all dressings, adaptic over A Cell and perform any further debridement needed, possible repeat application A Cell. Counseled that ultimately his decision for grafting, goal would be to get to end point of wound healing faster, limit drainage. He has progressed a lot with mobility and pain and discussed options for grafting to schedule as outpatient or overnight stay with home VAC. I would recommend given his significant pain that we plan removal VAC and first dressing change to donor site under sedation in OR as well a week later. Will discuss more after exam tomorrow.   Wife had questions regarding possible surgery for shoulder. Counseled could ask Rehab team to ask Orthopaedics to see patient prior to discharge for follow up. If surgery needed, I suspect Orthopaedics would want wounds healed.  15 minutes spent with patient, over half in counseling  Cecillia Menees, MD MBA Plastic & Reconstructive Surgery [phone]  

## 2015-05-28 ENCOUNTER — Inpatient Hospital Stay (HOSPITAL_COMMUNITY): Payer: Worker's Compensation

## 2015-05-28 ENCOUNTER — Inpatient Hospital Stay (HOSPITAL_COMMUNITY): Payer: Worker's Compensation | Admitting: Physical Therapy

## 2015-05-28 ENCOUNTER — Inpatient Hospital Stay (HOSPITAL_COMMUNITY): Payer: Self-pay

## 2015-05-28 ENCOUNTER — Encounter (HOSPITAL_COMMUNITY): Payer: Self-pay | Admitting: Plastic Surgery

## 2015-05-28 ENCOUNTER — Inpatient Hospital Stay (HOSPITAL_COMMUNITY): Payer: Self-pay | Admitting: Physical Therapy

## 2015-05-28 LAB — URINE CULTURE: Culture: >=100000

## 2015-05-28 MED ORDER — SORBITOL 70 % SOLN
60.0000 mL | Freq: Once | Status: AC
  Administered 2015-05-28: 60 mL via ORAL
  Filled 2015-05-28: qty 60
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
## 2015-05-28 NOTE — Progress Notes (Signed)
Occupational Therapy Session Note  Patient Details  Name: KEYLEN ECKENRODE MRN: [id number] Date of Birth: 09-13-1981  Today's Date: 05/28/2015 OT Individual Time: 7353-2992 OT Individual Time Calculation (min): 45 min  Pt missed 15 mins OT secondary to pain and fatigue.   Short Term Goals: Week 2:  OT Short Term Goal 1 (Week 2): Pt. will perform supine to EOB with min assist to prepare for ADLtaken  OT Short Term Goal 1 - Progress (Week 2): Met OT Short Term Goal 2 (Week 2): Pt will perform bathing with min A supine and sitting EOB OT Short Term Goal 2 - Progress (Week 2): Progressing toward goal OT Short Term Goal 3 (Week 2): Pt will performe LB dressing with mod A OT Short Term Goal 3 - Progress (Week 2): Met OT Short Term Goal 4 (Week 2): Pt will perform SPT to [facility] OT Short Term Goal 4 - Progress (Week 2): Met OT Short Term Goal 5 (Week 2): Pt will perform toileting tasks with mod A OT Short Term Goal 5 - Progress (Week 2): Met  Skilled Therapeutic Interventions/Progress Updates:    Pt resting in w/c with wife present.  Pt stated his right hip was painful from laying on right side during yesterday's procedure.  Pt engaged in LB bathing and dressing tasks at sink.  Pt stood at sink to [employer], bathe periarea, and don shorts.  Pt utilized reacher as appropriate.  Pt stood at sink to brush teeth.  Pt required more than reasonable amount of time to complete tasks with multiple rest breaks.  Discussed bathing arrangements at home and recommendations for DME at discharge. Pt returned to w/c with all needs within reach.  Focus on activity tolerance, sit<>stand, standing balance, discharge planning, and safety awareness.  Therapy Documentation Precautions:  Precautions Precautions: Fall Required Braces or Orthoses: Sling Restrictions Weight Bearing Restrictions: Yes LUE Weight Bearing: Non weight bearing RLE Weight Bearing: Weight bearing as tolerated LLE Weight Bearing: Weight bearing  as tolerated General: General OT Amount of Missed Time: 15 Minutes   Pain: Pain Assessment Pain Assessment: 0-10 Pain Score: 8  Pain Type: Acute pain Pain Location: Hip Pain Orientation: Right Pain Descriptors / Indicators: Aching Pain Onset: With Activity Pain Intervention(s): RN made aware;Repositioned  See FIM for current functional status  Therapy/Group: Individual Therapy  [name] 05/28/2015, 9:52 AM
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
## 2015-05-28 NOTE — Plan of Care (Signed)
Problem: RH Dressing Goal: LTG Patient will perform upper body dressing (OT) LTG Patient will perform upper body dressing with assist, with/without cues (OT).  Outcome: Not Applicable Date Met:  88/64/84 Goal discharged 7/6

## 2015-05-29 ENCOUNTER — Inpatient Hospital Stay (HOSPITAL_COMMUNITY): Payer: Worker's Compensation | Admitting: Physical Therapy

## 2015-05-29 ENCOUNTER — Encounter (HOSPITAL_COMMUNITY): Payer: Self-pay

## 2015-05-29 ENCOUNTER — Inpatient Hospital Stay (HOSPITAL_COMMUNITY): Payer: Self-pay

## 2015-05-29 MED ORDER — POLYETHYLENE GLYCOL 3350 17 G PO PACK
17.0000 g | PACK | Freq: Two times a day (BID) | ORAL | Status: DC
  Administered 2015-05-29 – 2015-05-30 (×3): 17 g via ORAL
  Filled 2015-05-29 (×5): qty 1

## 2015-05-29 MED ORDER — POLYETHYLENE GLYCOL 3350 17 G PO PACK: 17.0000 g | PACK | Freq: Every day | ORAL | Status: DC
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
## 2015-05-29 NOTE — Progress Notes (Signed)
Occupational Therapy Note  Patient Details  Name: Steve [name] MRN: [id number] Date of Birth: 1981-07-28  Today's Date: 05/29/2015 OT Individual Time: 1300-1400 OT Individual Time Calculation (min): 60 min   Pt denied pain Individual Therapy  Pt engaged in dynamic standing activities including performing squats to retrieve horseshoes from lower surface and placing them on basketball hoop.  Pt then retrieved horseshoes and tossed at target.  Pt transitioned to retrieving horsehoses from floor with reacher and hung them on basketball hoop.  Pt amb with LBQC to ADL apartment and practiced furniture transfers from lower surfaces.  Pt required close supervision with ambulation and transfers.  Pt amb with LBQC to bathroom for toileting before returning to room.  Focus on activity tolerance, sit<>stand, dynamic standing balance, functional transfers, BADLs, and safety awareness.   [name], Liela Rylee [facility]Chappell 05/29/2015, 2:27 PM

## 2015-05-29 NOTE — Progress Notes (Signed)
Occupational Therapy Discharge Summary  Patient Details  Name: Steve [name] MRN: [id number] Date of Birth: October 03, 1981  Patient has met 9 of 9 long term goals due to improved activity tolerance, improved balance, postural control and ability to compensate for deficits.  Pt made steady progress with BADLs during this admission and is supervision for toilet/shower transfers, mod I for toileting, and min A for bathing and dressing tasks.  Pt's wife has been present and participated in therapies. Patient to discharge at [facility] Assist level.  Patient's care partner is independent to provide the necessary physical assistance at discharge.    Recommendation:  Patient will benefit from ongoing skilled OT services in home health setting to continue to advance functional skills in the area of BADL and Reduce care partner burden.  Equipment: Shower seat  Reasons for discharge: treatment goals met and discharge from hospital  Patient/family agrees with progress made and goals achieved: Yes  OT Discharge Vision/Perception  Vision- History Baseline Vision/History: No visual deficits Patient Visual Report: No change from baseline  Cognition Overall Cognitive Status: Within Functional Limits for tasks assessed Arousal/Alertness: Awake/alert Orientation Level: Oriented X4 Attention: Alternating Sustained Attention: Appears intact Alternating Attention: Appears intact Memory: Appears intact Awareness: Appears intact Problem Solving: Appears intact Safety/Judgment: Appears intact Sensation Sensation Light Touch: Appears Intact Stereognosis: Not tested Hot/Cold: Appears Intact Proprioception: Appears Intact Coordination Gross Motor Movements are Fluid and Coordinated: Yes (RUE only; LUE immobilized) Fine Motor Movements are Fluid and Coordinated: Yes (RUE only; LUE immobilized) Coordination and Movement Description: RUE =wfl; LUE=impaired due to pain and NWB Motor  Motor Motor: Within  Functional Limits    Trunk/Postural Assessment  Cervical Assessment Cervical Assessment: Within Functional Limits Thoracic Assessment Thoracic Assessment: Within Functional Limits Lumbar Assessment Lumbar Assessment: Within Functional Limits  Balance Static Sitting Balance Static Sitting - Level of Assistance: 6: Modified independent (Device/Increase time) Extremity/Trunk Assessment RUE Assessment RUE Assessment: Within Functional Limits LUE Assessment LUE Assessment: Exceptions to [facility] (NWB)  See FIM for current functional status  [name] 05/29/2015, 2:57 PM

## 2015-05-29 NOTE — Progress Notes (Signed)
Physical Therapy Session Note  Patient Details  Name: Steve [name] MRN: [id number] Date of Birth: [birth year]  Today's Date: 05/29/2015 PT Individual Time: 1000-1100 PT Individual Time Calculation (min): 60 min   Short Term Goals: Week 3:  PT Short Term Goal 1 (Week 3): = LTG for D/C  Skilled Therapeutic Interventions/Progress Updates:   Session focused on actual car transfer, community ambulation, and stairs. Patient resting in reclining back wheelchair, c/o discomfort with positioning of LUE sling as he reported feeling as though he had to hold up the weight of his arm. With RN assisting, adjusted sling to improve support of LUE and decrease L upper trap muscle guarding. Patient transported off unit in wheelchair for actual car transfer to [employer] SUV with wife. Patient performed [facility] using [facility] with overall supervision and increased time. Patient tolerated transfer well. Discussed adding pillows for positioning/seating tolerance and pain management for car ride home after discharge. Patient ambulated in outdoor environment using [facility] with supervision up to 45 ft at a time on uneven surfaces including brick and concrete, over thresholds, and slight incline/declines. Upon returning to rehab unit, patient negotiated up/down one 6" step using R rail with min guard, step-to pattern ascending forwards leading with LLE and descending backwards leading with RLE. Patient left sitting edge of bed with all needs within reach.   Therapy Documentation Precautions:  Precautions Precautions: Fall Required Braces or Orthoses: Sling Restrictions Weight Bearing Restrictions: Yes LUE Weight Bearing: Non weight bearing RLE Weight Bearing: Weight bearing as tolerated LLE Weight Bearing: Weight bearing as tolerated Pain: Pain Assessment Pain Assessment: Faces Faces Pain Scale: Hurts little more Pain Type: Acute pain Pain Location: Back Pain Orientation: Left Pain Descriptors / Indicators:  Aching Pain Onset: On-going Pain Intervention(s): Repositioned (premedicated) Locomotion : Ambulation Ambulation/Gait Assistance: 5: Supervision   See FIM for current functional status  Therapy/Group: Individual Therapy  Kerney ElbeVarner, Jiraiya Mcewan A 05/29/2015, 11:52 AM
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
## 2015-05-30 ENCOUNTER — Inpatient Hospital Stay (HOSPITAL_COMMUNITY): Payer: Self-pay | Admitting: Physical Therapy

## 2015-05-30 ENCOUNTER — Inpatient Hospital Stay (HOSPITAL_COMMUNITY): Payer: Worker's Compensation | Admitting: Occupational Therapy

## 2015-05-30 ENCOUNTER — Inpatient Hospital Stay (HOSPITAL_COMMUNITY): Payer: Worker's Compensation | Admitting: Physical Therapy

## 2015-05-30 MED ORDER — TRAMADOL HCL 50 MG PO TABS: 50.0000 mg | ORAL_TABLET | Freq: Four times a day (QID) | ORAL | Status: DC

## 2015-05-30 MED ORDER — ZOLPIDEM TARTRATE 10 MG PO TABS: 10.0000 mg | ORAL_TABLET | Freq: Every evening | ORAL | Status: DC | PRN

## 2015-05-30 MED ORDER — OXYCODONE HCL 20 MG PO TABS: 10.0000 mg | ORAL_TABLET | Freq: Four times a day (QID) | ORAL | Status: DC | PRN

## 2015-05-30 MED ORDER — HYDROMORPHONE HCL 4 MG PO TABS: 4.0000 mg | ORAL_TABLET | Freq: Every day | ORAL | Status: DC

## 2015-05-30 MED ORDER — FLEET ENEMA 7-19 GM/118ML RE ENEM: 1.0000 | ENEMA | Freq: Every day | RECTAL | Status: DC | PRN

## 2015-05-30 MED ORDER — POLYETHYLENE GLYCOL 3350 17 G PO PACK: 17.0000 g | PACK | Freq: Two times a day (BID) | ORAL | Status: DC

## 2015-05-30 MED ORDER — FAMOTIDINE 20 MG PO TABS: 20.0000 mg | ORAL_TABLET | Freq: Two times a day (BID) | ORAL | Status: DC

## 2015-05-30 MED ORDER — SENNOSIDES-DOCUSATE SODIUM 8.6-50 MG PO TABS: 2.0000 | ORAL_TABLET | Freq: Two times a day (BID) | ORAL | Status: DC

## 2015-05-30 MED ORDER — SIMETHICONE 80 MG PO CHEW: 160.0000 mg | CHEWABLE_TABLET | Freq: Four times a day (QID) | ORAL | Status: DC

## 2015-05-30 MED ORDER — OXYCODONE HCL ER 60 MG PO T12A: 60.0000 mg | EXTENDED_RELEASE_TABLET | Freq: Two times a day (BID) | ORAL | Status: DC

## 2015-05-30 MED ORDER — METHOCARBAMOL 500 MG PO TABS: 1000.0000 mg | ORAL_TABLET | Freq: Four times a day (QID) | ORAL | Status: DC | PRN

## 2015-05-30 NOTE — Progress Notes (Signed)
Social Work  Discharge Note  The overall goal for the admission was met for:   Discharge location: Yes - home with wife and family providing 24/7 assistance  Length of Stay: Yes - 21 days  Discharge activity level: Yes - supervision/ mod independent/ min assist  Home/community participation: Yes  Services provided included: MD, RD, PT, OT, SLP, RN, TR, Pharmacy, Neuropsych and SW  Financial Services: Worker's Comp  Follow-up services arranged: Home Health: RN, PT, OT via [employer] arrangements, DME: 18x18 reclining back w/c, Roho cushion, hospital bed with air mattress, tub seat and LBQC - all being arranged via WC with various DME agencies. and Patient/Family has no preference for HH/DME agencies  Comments (or additional information):  WC also arranged to have ramp built at pt's home.  Patient/Family verbalized understanding of follow-up arrangements: Yes  Individual responsible for coordination of the follow-up plan: pt  Confirmed correct DME delivered: Able to confirm all DME received except w/c and cushion and cleared to have pt take our w/c home to prevent a delay in d/c.  WC CM continuing to work on getting the w/c and cushion and to stay in touch with pt and wife.   Steve [name]
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
## 2015-05-30 NOTE — Discharge Instructions (Signed)
Inpatient Rehab Discharge Instructions  Kem KaysMatthew B Szilagyi Discharge date and time:  05/30/15  Activities/Precautions/ Functional Status: Activity: activity as tolerated--no weight on left arm Diet: regular diet Wound Care: Coat wound with hydrogel. Cover with telfa/adaptic. Then cover with abd pad and kerlix. Avoid tape on skin.   Functional status:  ___ No restrictions     ___ Walk up steps independently _X__ 24/7 supervision/assistance   ___ Walk up steps with assistance ___ Intermittent supervision/assistance  ___ Bathe/dress independently _X__ Walk with walker    _X__ Bathe/dress with assistance ___ Walk Independently    ___ Shower independently ___ Walk with assistance    ___ Shower with assistance _X__ No alcohol     ___ Return to work/school ________   COMMUNITY REFERRALS UPON DISCHARGE:    Home Health:   PT        RN                  Agency:        Phone:   Medical Equipment/Items Ordered:                                                                          Agency/Supplier:       Special Instructions:    My questions have been answered and I understand these instructions. I will adhere to these goals and the provided educational materials after my discharge from the hospital.  Patient/Caregiver Signature _______________________________ Date __________  Clinician Signature _______________________________________ Date __________  Please bring this form and your medication list with you to all your follow-up doctor's appointments.

## 2015-05-30 NOTE — Progress Notes (Signed)
Orthopedic Tech Progress Note Patient Details:  Steve [name] 10/12/1981 [id number] Applied swathe to existing arm sling on LUE. Ortho Devices Type of Ortho Device: Sling immobilizer Ortho Device/Splint Location: LUE Ortho Device/Splint Interventions: Application   Lesle ChrisGilliland, Itzell Bendavid L 05/30/2015, 2:32 PM

## 2015-05-30 NOTE — Progress Notes (Signed)
Physical Therapy Discharge Summary  Patient Details  Name: Steve [name] MRN: [id number] Date of Birth: [birth year]  Patient has met 7 of 7 long term goals due to improved activity tolerance, improved balance, improved postural control, increased range of motion, decreased pain, ability to compensate for deficits and functional use of  right lower extremity and left lower extremity.  Patient to discharge at an ambulatory level Modified Independent.   Patient's care partner is independent to provide the necessary physical assistance at discharge.  Reasons goals not met: NA  Recommendation:  Patient will benefit from ongoing skilled PT services in home health setting to continue to advance safe functional mobility, address ongoing impairments in standing tolerance, endurance, strength, pain management, and minimize fall risk.  Equipment: hospital bed, large base quad cane, 18 x 18 reclining back wheelchair, 18 x 18 pressure relieving wheelchair cushion  Reasons for discharge: treatment goals met and discharge from hospital  Patient/family agrees with progress made and goals achieved: Yes  PT Discharge Precautions/Restrictions Precautions Required Braces or Orthoses: Sling Restrictions Weight Bearing Restrictions: Yes LUE Weight Bearing: Non weight bearing RLE Weight Bearing: Weight bearing as tolerated LLE Weight Bearing: Weight bearing as tolerated Vision/Perception   No changes from baseline  Cognition Overall Cognitive Status: Within Functional Limits for tasks assessed Arousal/Alertness: Awake/alert Orientation Level: Oriented X4 Attention: Alternating Sustained Attention: Appears intact Alternating Attention: Appears intact Memory: Appears intact Awareness: Appears intact Problem Solving: Appears intact Safety/Judgment: Appears intact Sensation Sensation Light Touch: Appears Intact Stereognosis: Not tested Hot/Cold: Appears Intact Proprioception: Appears  Intact Coordination Gross Motor Movements are Fluid and Coordinated: Yes (RUE only; LUE immobilized) Fine Motor Movements are Fluid and Coordinated: Yes (RUE only; LUE immobilized) Coordination and Movement Description: impaired due to pain and NWB Motor  Motor Motor: Within Functional Limits  Mobility Bed Mobility Bed Mobility: Sit to Supine;Supine to Sit Supine to Sit: 6: Modified independent (Device/Increase time);HOB elevated;With rails Sit to Supine: 6: Modified independent (Device/Increase time);HOB elevated;With rail Transfers Transfers: Yes Sit to Stand: 6: Modified independent (Device/Increase time);With armrests;From chair/3-in-1;With upper extremity assist;From bed Stand to Sit: 6: Modified independent (Device/Increase time);With upper extremity assist;With armrests;To bed;To chair/3-in-1 Stand Pivot Transfers: 6: Modified independent (Device/Increase time);With armrests Locomotion  Ambulation Ambulation: Yes Ambulation/Gait Assistance: 6: Modified independent (Device/Increase time) Ambulation Distance (Feet): 200 Feet Assistive device: Large base quad cane Gait Gait: Yes Gait Pattern: Impaired Gait Pattern: Step-through pattern;Decreased stance time - left;Decreased step length - left;Decreased stride length;Decreased dorsiflexion - left;Antalgic;Trunk flexed Gait velocity: 10 MWT = 0.32 m/s Stairs / Additional Locomotion Stairs: Yes Stairs Assistance: 5: Supervision Stair Management Technique: One rail Right;Step to pattern;Forwards Number of Stairs: 12 Height of Stairs: 6 Ramp: 6: Modified independent (Device) Curb: Not tested (comment) Wheelchair Mobility Wheelchair Mobility: No  Trunk/Postural Assessment  Cervical Assessment Cervical Assessment: Within Functional Limits Thoracic Assessment Thoracic Assessment: Within Functional Limits Lumbar Assessment Lumbar Assessment: Within Functional Limits Postural Control Postural Control: Within Functional Limits   Balance Static Standing Balance Static Standing - Balance Support: During functional activity;Right upper extremity supported;No upper extremity supported Static Standing - Level of Assistance: 6: Modified independent (Device/Increase time) Dynamic Standing Balance Dynamic Standing - Balance Support: Right upper extremity supported;During functional activity Dynamic Standing - Level of Assistance: 6: Modified independent (Device/Increase time) Extremity Assessment  RUE Assessment RUE Assessment: Within Functional Limits LUE Assessment LUE Assessment: Exceptions to WFL (NWB) RLE Assessment RLE Assessment: Exceptions to [facility] RLE AROM (degrees) Overall AROM Right Lower Extremity: Deficits;Due to pain RLE  Strength RLE Overall Strength: Deficits;Due to pain LLE Assessment LLE Assessment: Exceptions to WFL LLE AROM (degrees) Overall AROM Left Lower Extremity: Deficits;Due to pain LLE Strength LLE Overall Strength: Deficits;Due to pain  See FIM for current functional status  Laretta Alstrom 05/30/2015, 5:01 PM
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
## 2015-05-30 NOTE — Progress Notes (Signed)
Physical Therapy Session Note  Patient Details  Name: Steve KaysMatthew B [name] MRN: [id number] Date of Birth: 26-Nov-1980  Today's Date: 05/30/2015 PT Individual Time: 1100-1200  PT Individual Time Calculation (min): 60 min    Short Term Goals: Week 3:  PT Short Term Goal 1 (Week 3): = LTG for D/C  Skilled Therapeutic Interventions/Progress Updates:   Session 1: Patient resting in bed, reporting wife at home awaiting delivery of hospital bed and people to build ramp. Patient transferred supine > sit without device and sit > supine with use of leg lifter and use of hospital bed functions with mod I. Adjusted sling for LUE comfort/decreased muscle guarding and discussed available options for sling with RN and PA, plan to trial swatch for improved comfort/pain management. Gait using LBQC 2 x 200 ft with mod I. 10 MWT = 0.32 m/s. Standing alternating step taps to 6" step using R rail for increased RLE WB tolerance in preparation for negotiating stairs. Stair training up/down twelve 6" steps using RUE support on rail with supervision, step-to pattern leading with LLE ascending and RLE descending. Reviewed standing HEP for balance and strengthening with patient. Patient performed toileting in room with mod I. Patient with no further questions regarding discharge, left semi reclined in bed with all needs within reach.    Therapy Documentation Precautions:  Precautions Precautions: Fall Required Braces or Orthoses: Sling Restrictions Weight Bearing Restrictions: Yes LUE Weight Bearing: Non weight bearing RLE Weight Bearing: Weight bearing as tolerated LLE Weight Bearing: Weight bearing as tolerated Pain: Pain Assessment Pain Assessment: 0-10 Pain Score: 5  Pain Type: Acute pain Pain Location: Pelvis Pain Orientation: Right;Left Pain Descriptors / Indicators: Aching Pain Onset: With Activity Pain Intervention(s): Repositioned;Emotional support Locomotion : Ambulation Ambulation/Gait Assistance: 6:  Modified independent (Device/Increase time)   See FIM for current functional status  Therapy/Group: Individual Therapy  Kerney ElbeVarner, Deshunda Thackston A 05/30/2015, 11:50 AM

## 2015-05-30 NOTE — Progress Notes (Signed)
Pt. discharged to home in care of wife. Both verbalize understanding of dc instructions provided by Marissa NestlePam Love, PAC.. Supplies given for dressing changes through weekend.  Pt's wife proficient in procedure. VSS.  Oxy. IR 20mg  and Robaxin 1000mg  given prior to D/C per request.

## 2015-05-30 NOTE — Progress Notes (Signed)
Occupational Therapy Session Note  Patient Details  Name: Steve KaysMatthew B [name] MRN: [id number] Date of Birth: [birth year]  Today's Date: 05/30/2015 OT Individual Time: 0930-1030 OT Individual Time Calculation (min): 60 min    Short Term Goals: Week 3:  OT Short Term Goal 1 (Week 3): STG=LTG secondary to ELOS  Skilled Therapeutic Interventions/Progress Updates:    Engaged in ADL retraining and therapeutic activity with focus on functional mobility and weight shifting in standing.  Pt in bed upon arrival, reporting pain 7/10 in back but willing to participate in treatment session.  Pt required increased time for bed mobility, but independent with use of bed settings.  Supervision with ambulation to/from toilet with LBQC, pt mod I with toileting this session.  Returned to EOB to rest prior to next activity.  Pt doffed and donned shoes with use of reacher and bending forward.  Engaged in standing, weight shifting activity with toe tapping, focus on weight shifting and ability to tolerate standing on RLE while tapping with LLE.  Pt reports increase pain and decreased tolerance with weight shifting onto RLE, but able to complete activity with steady assist.  Pt required multiple rest breaks due to pain.  Pt left seated at EOB with all needs in reach.    Therapy Documentation Precautions:  Precautions Precautions: Fall Required Braces or Orthoses: Sling Restrictions Weight Bearing Restrictions: Yes LUE Weight Bearing: Non weight bearing RLE Weight Bearing: Weight bearing as tolerated LLE Weight Bearing: Weight bearing as tolerated General:   Vital Signs: Therapy Vitals Temp: 98.2 F (36.8 C) Temp Source: Oral Pulse Rate: 68 Resp: 18 BP: 115/71 mmHg Patient Position (if appropriate): Lying Oxygen Therapy SpO2: 95 % O2 Device: Not Delivered Pain: Pain Assessment Pain Assessment: 0-10 Pain Score: 5  Pain Type: Acute pain Pain Location: Pelvis Pain Orientation: Right;Left Pain Descriptors  / Indicators: Aching Pain Onset: With Activity Pain Intervention(s): Repositioned;Emotional support  See FIM for current functional status  Therapy/Group: Individual Therapy  [name] 05/30/2015, 1:00 PM
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
## 2015-06-02 ENCOUNTER — Encounter (HOSPITAL_BASED_OUTPATIENT_CLINIC_OR_DEPARTMENT_OTHER): Payer: Self-pay | Admitting: *Deleted

## 2015-06-03 ENCOUNTER — Telehealth: Payer: Self-pay | Admitting: *Deleted

## 2015-06-03 NOTE — Pre-Procedure Instructions (Signed)
History discussed with Dr. Ivin Bootyrews; pt. OK to come for surgery

## 2015-06-03 NOTE — Telephone Encounter (Signed)
Just an FYI call.  [facility] PenS[facility] PT called to let Dr Riley KillSwartz know that [name] home PT start of care is delayed to 06/04/15 due to conflict with MD appts today.

## 2015-06-04 ENCOUNTER — Telehealth: Payer: Self-pay | Admitting: *Deleted

## 2015-06-04 NOTE — Telephone Encounter (Signed)
PT eval done today.  They will see 2 wk1 and 3 wk 3 to work on balance strength ROM and gate training.

## 2015-06-06 ENCOUNTER — Ambulatory Visit (HOSPITAL_BASED_OUTPATIENT_CLINIC_OR_DEPARTMENT_OTHER): Payer: Worker's Compensation | Admitting: Anesthesiology

## 2015-06-06 ENCOUNTER — Encounter (HOSPITAL_BASED_OUTPATIENT_CLINIC_OR_DEPARTMENT_OTHER): Payer: Self-pay | Admitting: *Deleted

## 2015-06-06 ENCOUNTER — Encounter (HOSPITAL_BASED_OUTPATIENT_CLINIC_OR_DEPARTMENT_OTHER)
Admission: RE | Disposition: A | Discharge status: Home / Self Care | Payer: Self-pay | Source: Ambulatory Visit | Attending: Plastic Surgery

## 2015-06-06 ENCOUNTER — Ambulatory Visit (HOSPITAL_BASED_OUTPATIENT_CLINIC_OR_DEPARTMENT_OTHER)
Admission: RE | Admit: 2015-06-06 | Discharge: 2015-06-06 | Disposition: A | Discharge status: Home / Self Care | Payer: Worker's Compensation | Source: Ambulatory Visit | Attending: Plastic Surgery | Admitting: Plastic Surgery

## 2015-06-06 DIAGNOSIS — Y9269 Other specified industrial and construction area as the place of occurrence of the external cause: Secondary | ICD-10-CM | POA: Insufficient documentation

## 2015-06-06 DIAGNOSIS — K219 Gastro-esophageal reflux disease without esophagitis: Secondary | ICD-10-CM | POA: Diagnosis not present

## 2015-06-06 DIAGNOSIS — T2230XD Burn of third degree of shoulder and upper limb, except wrist and hand, unspecified site, subsequent encounter: Secondary | ICD-10-CM | POA: Insufficient documentation

## 2015-06-06 DIAGNOSIS — T3122 Burns involving 20-29% of body surface with 20-29% third degree burns: Secondary | ICD-10-CM | POA: Diagnosis not present

## 2015-06-06 DIAGNOSIS — T2134XA Burn of third degree of lower back, initial encounter: Secondary | ICD-10-CM | POA: Insufficient documentation

## 2015-06-06 DIAGNOSIS — Z87891 Personal history of nicotine dependence: Secondary | ICD-10-CM | POA: Insufficient documentation

## 2015-06-06 DIAGNOSIS — Y990 Civilian activity done for income or pay: Secondary | ICD-10-CM | POA: Insufficient documentation

## 2015-06-06 DIAGNOSIS — X17XXXA Contact with hot engines, machinery and tools, initial encounter: Secondary | ICD-10-CM | POA: Diagnosis not present

## 2015-06-06 HISTORY — DX: Burn of unspecified body region, unspecified degree: T30.0

## 2015-06-06 HISTORY — DX: Adverse effect of unspecified anesthetic, initial encounter: T41.45XA

## 2015-06-06 HISTORY — DX: Gastro-esophageal reflux disease without esophagitis: K21.9

## 2015-06-06 HISTORY — DX: Other complications of anesthesia, initial encounter: T88.59XA

## 2015-06-06 HISTORY — DX: Fracture of unspecified parts of lumbosacral spine and pelvis, initial encounter for closed fracture: S32.9XXA

## 2015-06-06 HISTORY — PX: WOUND DEBRIDEMENT: SHX247

## 2015-06-06 HISTORY — DX: Fracture of left shoulder girdle, part unspecified, initial encounter for closed fracture: S42.92XA

## 2015-06-06 LAB — POCT HEMOGLOBIN-HEMACUE: HEMOGLOBIN: 14.3 g/dL (ref 13.0–17.0)

## 2015-06-06 SURGERY — DEBRIDEMENT, WOUND
Anesthesia: General | Site: Back | Laterality: Left

## 2015-06-06 MED ORDER — MIDAZOLAM HCL 2 MG/2ML IJ SOLN: 1.0000 mg | INTRAMUSCULAR | Status: DC | PRN

## 2015-06-06 MED ORDER — ONDANSETRON HCL 4 MG/2ML IJ SOLN: 4.0000 mg | Freq: Four times a day (QID) | INTRAMUSCULAR | Status: DC | PRN

## 2015-06-06 MED ORDER — SUCCINYLCHOLINE CHLORIDE 20 MG/ML IJ SOLN
INTRAMUSCULAR | Status: DC | PRN
  Administered 2015-06-06: 50 mg via INTRAVENOUS

## 2015-06-06 MED ORDER — HYDROMORPHONE HCL 1 MG/ML IJ SOLN
INTRAMUSCULAR | Status: AC
  Filled 2015-06-06: qty 1

## 2015-06-06 MED ORDER — CEFAZOLIN SODIUM-DEXTROSE 2-3 GM-% IV SOLR
INTRAVENOUS | Status: AC
  Filled 2015-06-06: qty 50

## 2015-06-06 MED ORDER — OXYCODONE HCL 5 MG PO TABS
5.0000 mg | ORAL_TABLET | Freq: Once | ORAL | Status: AC | PRN
  Administered 2015-06-06: 5 mg via ORAL

## 2015-06-06 MED ORDER — PROPOFOL 10 MG/ML IV BOLUS
INTRAVENOUS | Status: AC
Start: 2015-06-06 — End: 2015-06-06
  Filled 2015-06-06: qty 80

## 2015-06-06 MED ORDER — FENTANYL CITRATE (PF) 100 MCG/2ML IJ SOLN
INTRAMUSCULAR | Status: DC | PRN
  Administered 2015-06-06 (×3): 100 ug via INTRAVENOUS

## 2015-06-06 MED ORDER — MIDAZOLAM HCL 2 MG/2ML IJ SOLN
INTRAMUSCULAR | Status: AC
Start: 2015-06-06 — End: 2015-06-06
  Filled 2015-06-06: qty 2

## 2015-06-06 MED ORDER — FENTANYL CITRATE (PF) 100 MCG/2ML IJ SOLN
INTRAMUSCULAR | Status: AC
  Filled 2015-06-06: qty 2

## 2015-06-06 MED ORDER — OXYCODONE HCL 5 MG PO TABS
ORAL_TABLET | ORAL | Status: AC
  Filled 2015-06-06: qty 1

## 2015-06-06 MED ORDER — HYDROMORPHONE HCL 1 MG/ML IJ SOLN
0.2500 mg | INTRAMUSCULAR | Status: DC | PRN
  Administered 2015-06-06 (×2): 0.5 mg via INTRAVENOUS

## 2015-06-06 MED ORDER — OXYCODONE HCL 5 MG/5ML PO SOLN
5.0000 mg | Freq: Once | ORAL | Status: AC | PRN
Start: 2015-06-06 — End: 2015-06-06

## 2015-06-06 MED ORDER — FENTANYL CITRATE (PF) 100 MCG/2ML IJ SOLN: 50.0000 ug | INTRAMUSCULAR | Status: DC | PRN

## 2015-06-06 MED ORDER — LIDOCAINE HCL (CARDIAC) 20 MG/ML IV SOLN
INTRAVENOUS | Status: DC | PRN
  Administered 2015-06-06: 50 mg via INTRAVENOUS

## 2015-06-06 MED ORDER — MIDAZOLAM HCL 5 MG/5ML IJ SOLN
INTRAMUSCULAR | Status: DC | PRN
  Administered 2015-06-06: 2 mg via INTRAVENOUS

## 2015-06-06 MED ORDER — GLYCOPYRROLATE 0.2 MG/ML IJ SOLN: 0.2000 mg | Freq: Once | INTRAMUSCULAR | Status: DC | PRN

## 2015-06-06 MED ORDER — LACTATED RINGERS IV SOLN
INTRAVENOUS | Status: DC
  Administered 2015-06-06 (×2): via INTRAVENOUS

## 2015-06-06 MED ORDER — PROPOFOL 10 MG/ML IV BOLUS
INTRAVENOUS | Status: DC | PRN
  Administered 2015-06-06: 250 mg via INTRAVENOUS

## 2015-06-06 MED ORDER — SCOPOLAMINE 1 MG/3DAYS TD PT72: 1.0000 | MEDICATED_PATCH | Freq: Once | TRANSDERMAL | Status: DC | PRN

## 2015-06-06 MED ORDER — ONDANSETRON HCL 4 MG/2ML IJ SOLN
INTRAMUSCULAR | Status: DC | PRN
  Administered 2015-06-06: 4 mg via INTRAVENOUS

## 2015-06-06 MED ORDER — DEXAMETHASONE SODIUM PHOSPHATE 4 MG/ML IJ SOLN
INTRAMUSCULAR | Status: DC | PRN
  Administered 2015-06-06: 10 mg via INTRAVENOUS

## 2015-06-06 MED ORDER — FENTANYL CITRATE (PF) 100 MCG/2ML IJ SOLN
INTRAMUSCULAR | Status: AC
  Filled 2015-06-06: qty 6

## 2015-06-06 MED ORDER — PROPOFOL 500 MG/50ML IV EMUL
INTRAVENOUS | Status: AC
  Filled 2015-06-06: qty 50

## 2015-06-06 MED ORDER — CEFAZOLIN SODIUM-DEXTROSE 2-3 GM-% IV SOLR
2.0000 g | INTRAVENOUS | Status: AC
  Administered 2015-06-06: 2 g via INTRAVENOUS

## 2015-06-06 SURGICAL SUPPLY — 36 items
BANDAGE ELASTIC 4 VELCRO ST LF (GAUZE/BANDAGES/DRESSINGS) ×4 IMPLANT
BINDER BREAST 3XL (BIND) ×4 IMPLANT
BLADE SURG 15 STRL LF DISP TIS (BLADE) ×2 IMPLANT
BLADE SURG 15 STRL SS (BLADE) ×2
BNDG COHESIVE 4X5 TAN STRL (GAUZE/BANDAGES/DRESSINGS) ×4 IMPLANT
BNDG GAUZE ELAST 4 BULKY (GAUZE/BANDAGES/DRESSINGS) ×4 IMPLANT
CANISTER SUCT 1200ML W/VALVE (MISCELLANEOUS) ×4 IMPLANT
COVER BACK TABLE 60X90IN (DRAPES) ×4 IMPLANT
COVER MAYO STAND STRL (DRAPES) ×4 IMPLANT
DRAPE U-SHAPE 76X120 STRL (DRAPES) ×8 IMPLANT
DRSG ADAPTIC 3X8 NADH LF (GAUZE/BANDAGES/DRESSINGS) ×28 IMPLANT
DRSG PAD ABDOMINAL 8X10 ST (GAUZE/BANDAGES/DRESSINGS) ×24 IMPLANT
ELECT REM PT RETURN 9FT ADLT (ELECTROSURGICAL) ×4
ELECTRODE REM PT RTRN 9FT ADLT (ELECTROSURGICAL) ×2 IMPLANT
GLOVE BIO SURGEON STRL SZ 6 (GLOVE) ×4 IMPLANT
GLOVE BIO SURGEON STRL SZ7 (GLOVE) ×4 IMPLANT
GLOVE EXAM NITRILE PF MED BLUE (GLOVE) ×4 IMPLANT
GOWN STRL REUS W/ TWL LRG LVL3 (GOWN DISPOSABLE) ×2 IMPLANT
GOWN STRL REUS W/ TWL XL LVL3 (GOWN DISPOSABLE) ×2 IMPLANT
GOWN STRL REUS W/TWL LRG LVL3 (GOWN DISPOSABLE) ×2
GOWN STRL REUS W/TWL XL LVL3 (GOWN DISPOSABLE) ×2
NS IRRIG 1000ML POUR BTL (IV SOLUTION) ×4 IMPLANT
PACK BASIN DAY SURGERY FS (CUSTOM PROCEDURE TRAY) ×4 IMPLANT
PENCIL BUTTON HOLSTER BLD 10FT (ELECTRODE) ×4 IMPLANT
SHEET MEDIUM DRAPE 40X70 STRL (DRAPES) ×4 IMPLANT
SLEEVE SCD COMPRESS KNEE MED (MISCELLANEOUS) ×4 IMPLANT
SPONGE LAP 18X18 X RAY DECT (DISPOSABLE) ×12 IMPLANT
STAPLER VISISTAT 35W (STAPLE) ×4 IMPLANT
STOCKINETTE IMPERVIOUS LG (DRAPES) ×4 IMPLANT
SUT VIC AB 3-0 FS2 27 (SUTURE) ×12 IMPLANT
SYR BULB IRRIGATION 50ML (SYRINGE) ×4 IMPLANT
TOWEL OR 17X24 6PK STRL BLUE (TOWEL DISPOSABLE) ×8 IMPLANT
TRAY DSU PREP LF (CUSTOM PROCEDURE TRAY) ×4 IMPLANT
TUBE CONNECTING 20'X1/4 (TUBING) ×1
TUBE CONNECTING 20X1/4 (TUBING) ×3 IMPLANT
UNDERPAD 30X30 (UNDERPADS AND DIAPERS) ×8 IMPLANT

## 2015-06-06 NOTE — Anesthesia Preprocedure Evaluation (Signed)
Anesthesia Evaluation  Patient identified by MRN, date of birth, ID band Patient awake    Reviewed: Allergy & Precautions, NPO status , Patient's Chart, lab work & pertinent test results  Airway Mallampati: II   Neck ROM: full    Dental   Pulmonary former smoker,    breath sounds clear to auscultation       Cardiovascular negative cardio ROS   Rhythm:regular Rate:Normal     Neuro/Psych    GI/Hepatic GERD  ,  Endo/Other    Renal/GU      Musculoskeletal   Abdominal   Peds  Hematology   Anesthesia Other Findings   Reproductive/Obstetrics                             Anesthesia Physical Anesthesia Plan  ASA: II  Anesthesia Plan: General   Post-op Pain Management:    Induction: Intravenous  Airway Management Planned: Oral ETT  Additional Equipment:   Intra-op Plan:   Post-operative Plan: Extubation in OR  Informed Consent: I have reviewed the patients History and Physical, chart, labs and discussed the procedure including the risks, benefits and alternatives for the proposed anesthesia with the patient or authorized representative who has indicated his/her understanding and acceptance.     Plan Discussed with: CRNA, Anesthesiologist and Surgeon  Anesthesia Plan Comments:         Anesthesia Quick Evaluation  

## 2015-06-06 NOTE — Addendum Note (Signed)
Addendum  created 06/06/15 1329 by Norvelt DesanctisMargaret L Garvey Westcott, CRNA   Modules edited: Anesthesia Blocks and Procedures, Clinical Notes   Clinical Notes:  File: [id number]

## 2015-06-06 NOTE — Discharge Instructions (Signed)

## 2015-06-06 NOTE — Anesthesia Postprocedure Evaluation (Signed)
Anesthesia Post Note  Patient: Steve KaysMatthew B [name]  Procedure(s) Performed: Procedure(s) (LRB): DEBRIDEMENT OF BACK AND LEFT ARM  (Left)  Anesthesia type: General  Patient location: PACU  Post pain: Pain level controlled and Adequate analgesia  Post assessment: Post-op Vital signs reviewed, Patient's Cardiovascular Status Stable, Respiratory Function Stable, Patent Airway and Pain level controlled  Last Vitals:  Filed Vitals:   06/06/15 1230  BP:   Pulse: 64  Temp:   Resp: 12    Post vital signs: Reviewed and stable  Level of consciousness: awake, alert  and oriented  Complications: No apparent anesthesia complications

## 2015-06-06 NOTE — Anesthesia Procedure Notes (Signed)
Procedure Name: Intubation Date/Time: 06/06/2015 10:21 AM Performed by: [place] DesanctisLINKA, Sirr Kabel L Pre-anesthesia Checklist: Patient identified, Emergency Drugs available, Suction available, Patient being monitored and Timeout performed Patient Re-evaluated:Patient Re-evaluated prior to inductionOxygen Delivery Method: Circle System Utilized Preoxygenation: Pre-oxygenation with 100% oxygen Intubation Type: IV induction Ventilation: Mask ventilation without difficulty Laryngoscope Size: Miller and 3 Grade View: Grade II Tube type: Oral Tube size: 8.0 mm Number of attempts: 1 Airway Equipment and Method: Stylet and Oral airway Placement Confirmation: ETT inserted through vocal cords under direct vision,  positive ETCO2 and breath sounds checked- equal and bilateral Secured at: 21 cm Tube secured with: Tape Dental Injury: Teeth and Oropharynx as per pre-operative assessment
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
## 2015-06-06 NOTE — Transfer of Care (Signed)
Immediate Anesthesia Transfer of Care Note  Patient: Steve KaysMatthew B Monger  Procedure(s) Performed: Procedure(s): DEBRIDEMENT OF BACK AND LEFT ARM  (Left)  Patient Location: PACU  Anesthesia Type:General  Level of Consciousness: sedated  Airway & Oxygen Therapy: Patient Spontanous Breathing and Patient connected to face mask oxygen  Post-op Assessment: Report given to RN and Post -op Vital signs reviewed and stable  Post vital signs: Reviewed and stable  Last Vitals:  Filed Vitals:   06/06/15 0938  BP: 142/80  Pulse: 72  Temp: 36.9 C  Resp: 20    Complications: No apparent anesthesia complications
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
## 2015-06-06 NOTE — Progress Notes (Signed)
Due to decreased mobility and possible equipment needs for nursing (that we do not have here), pt to have surgery at the Main OR going forward per Dr Ivin Bootyrews.

## 2015-06-09 ENCOUNTER — Encounter (HOSPITAL_BASED_OUTPATIENT_CLINIC_OR_DEPARTMENT_OTHER): Payer: Self-pay | Admitting: Plastic Surgery

## 2015-06-30 ENCOUNTER — Other Ambulatory Visit: Payer: Self-pay | Admitting: Physical Medicine and Rehabilitation

## 2015-07-22 ENCOUNTER — Encounter: Payer: Self-pay | Admitting: Physical Medicine & Rehabilitation

## 2015-07-22 ENCOUNTER — Encounter
Payer: Worker's Compensation | Attending: Physical Medicine & Rehabilitation | Admitting: Physical Medicine & Rehabilitation

## 2015-07-22 VITALS — BP 123/72 | HR 75 | Resp 16

## 2015-07-22 DIAGNOSIS — S32501S Unspecified fracture of right pubis, sequela: Secondary | ICD-10-CM

## 2015-07-22 DIAGNOSIS — T300 Burn of unspecified body region, unspecified degree: Secondary | ICD-10-CM

## 2015-07-22 DIAGNOSIS — M545 Low back pain: Secondary | ICD-10-CM | POA: Diagnosis not present

## 2015-07-22 DIAGNOSIS — S329XXS Fracture of unspecified parts of lumbosacral spine and pelvis, sequela: Secondary | ICD-10-CM

## 2015-07-22 DIAGNOSIS — S32591S Other specified fracture of right pubis, sequela: Secondary | ICD-10-CM

## 2015-07-22 DIAGNOSIS — X58XXXS Exposure to other specified factors, sequela: Secondary | ICD-10-CM | POA: Diagnosis not present

## 2015-07-22 DIAGNOSIS — K219 Gastro-esophageal reflux disease without esophagitis: Secondary | ICD-10-CM | POA: Insufficient documentation

## 2015-07-22 DIAGNOSIS — S42302S Unspecified fracture of shaft of humerus, left arm, sequela: Secondary | ICD-10-CM | POA: Diagnosis not present

## 2015-07-22 DIAGNOSIS — S32592S Other specified fracture of left pubis, sequela: Secondary | ICD-10-CM

## 2015-07-22 DIAGNOSIS — Z87891 Personal history of nicotine dependence: Secondary | ICD-10-CM | POA: Diagnosis not present

## 2015-07-22 DIAGNOSIS — S32502S Unspecified fracture of left pubis, sequela: Secondary | ICD-10-CM

## 2015-07-22 DIAGNOSIS — S3282XS Multiple fractures of pelvis without disruption of pelvic ring, sequela: Secondary | ICD-10-CM | POA: Insufficient documentation

## 2015-07-22 MED ORDER — HYDROCODONE-ACETAMINOPHEN 5-325 MG PO TABS: 1.0000 | ORAL_TABLET | Freq: Two times a day (BID) | ORAL | Status: DC | PRN
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
## 2015-07-22 NOTE — Patient Instructions (Signed)
PLEASE CALL ME WITH ANY PROBLEMS OR QUESTIONS (#[phone]).  HAVE A GOOD DAY!    

## 2015-08-03 ENCOUNTER — Other Ambulatory Visit: Payer: Self-pay | Admitting: Physical Medicine and Rehabilitation

## 2015-08-08 MED ORDER — HYDROMORPHONE HCL 1 MG/ML IJ SOLN
INTRAMUSCULAR | Status: AC
  Filled 2015-08-08: qty 1

## 2015-09-22 ENCOUNTER — Encounter
Payer: Worker's Compensation | Attending: Physical Medicine & Rehabilitation | Admitting: Physical Medicine & Rehabilitation

## 2015-09-22 ENCOUNTER — Encounter: Payer: Self-pay | Admitting: Physical Medicine & Rehabilitation

## 2015-09-22 VITALS — BP 135/71 | HR 63 | Resp 15

## 2015-09-22 DIAGNOSIS — Z87891 Personal history of nicotine dependence: Secondary | ICD-10-CM | POA: Insufficient documentation

## 2015-09-22 DIAGNOSIS — M545 Low back pain, unspecified: Secondary | ICD-10-CM | POA: Insufficient documentation

## 2015-09-22 DIAGNOSIS — N529 Male erectile dysfunction, unspecified: Secondary | ICD-10-CM | POA: Diagnosis not present

## 2015-09-22 DIAGNOSIS — K219 Gastro-esophageal reflux disease without esophagitis: Secondary | ICD-10-CM | POA: Insufficient documentation

## 2015-09-22 DIAGNOSIS — S42302S Unspecified fracture of shaft of humerus, left arm, sequela: Secondary | ICD-10-CM | POA: Diagnosis not present

## 2015-09-22 DIAGNOSIS — S32501S Unspecified fracture of right pubis, sequela: Secondary | ICD-10-CM

## 2015-09-22 DIAGNOSIS — S32592S Other specified fracture of left pubis, sequela: Secondary | ICD-10-CM

## 2015-09-22 DIAGNOSIS — S32591S Other specified fracture of right pubis, sequela: Secondary | ICD-10-CM

## 2015-09-22 DIAGNOSIS — S3282XS Multiple fractures of pelvis without disruption of pelvic ring, sequela: Secondary | ICD-10-CM | POA: Insufficient documentation

## 2015-09-22 DIAGNOSIS — S43005S Unspecified dislocation of left shoulder joint, sequela: Secondary | ICD-10-CM | POA: Diagnosis not present

## 2015-09-22 DIAGNOSIS — S32502S Unspecified fracture of left pubis, sequela: Secondary | ICD-10-CM

## 2015-09-22 MED ORDER — HYDROCODONE-ACETAMINOPHEN 5-325 MG PO TABS: 1.0000 | ORAL_TABLET | Freq: Two times a day (BID) | ORAL | Status: AC | PRN

## 2015-09-22 MED ORDER — MELOXICAM 15 MG PO TABS: 15.0000 mg | ORAL_TABLET | Freq: Every day | ORAL | Status: AC

## 2015-09-22 NOTE — Patient Instructions (Signed)
IF YOUR BACK STARTS TO WORSEN OR YOU EXPERIENCE INCREASED NUMBNESS IN YOUR RIGHT LEG, THEN  CONTACT ME.
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
## 2015-11-12 ENCOUNTER — Encounter
Payer: Worker's Compensation | Attending: Physical Medicine & Rehabilitation | Admitting: Physical Medicine & Rehabilitation

## 2015-11-12 DIAGNOSIS — M545 Low back pain: Secondary | ICD-10-CM | POA: Insufficient documentation

## 2015-11-12 DIAGNOSIS — S42302S Unspecified fracture of shaft of humerus, left arm, sequela: Secondary | ICD-10-CM | POA: Insufficient documentation

## 2015-11-12 DIAGNOSIS — S3282XS Multiple fractures of pelvis without disruption of pelvic ring, sequela: Secondary | ICD-10-CM | POA: Insufficient documentation

## 2015-11-12 DIAGNOSIS — Z87891 Personal history of nicotine dependence: Secondary | ICD-10-CM | POA: Insufficient documentation

## 2015-11-12 DIAGNOSIS — K219 Gastro-esophageal reflux disease without esophagitis: Secondary | ICD-10-CM | POA: Insufficient documentation

## 2018-06-13 ENCOUNTER — Emergency Department
Admission: EM | Admit: 2018-06-13 | Discharge: 2018-06-13 | Disposition: A | Discharge status: Home / Self Care | Payer: 59 | Attending: Emergency Medicine | Admitting: Emergency Medicine

## 2018-06-13 ENCOUNTER — Encounter: Payer: Self-pay | Admitting: Emergency Medicine

## 2018-06-13 ENCOUNTER — Other Ambulatory Visit: Payer: Self-pay

## 2018-06-13 ENCOUNTER — Emergency Department: Payer: 59

## 2018-06-13 DIAGNOSIS — R51 Headache: Secondary | ICD-10-CM | POA: Insufficient documentation

## 2018-06-13 DIAGNOSIS — F17210 Nicotine dependence, cigarettes, uncomplicated: Secondary | ICD-10-CM | POA: Diagnosis not present

## 2018-06-13 DIAGNOSIS — R519 Headache, unspecified: Secondary | ICD-10-CM

## 2018-06-13 DIAGNOSIS — Z79899 Other long term (current) drug therapy: Secondary | ICD-10-CM | POA: Insufficient documentation

## 2018-06-13 LAB — CBC WITH DIFFERENTIAL/PLATELET
Basophils Absolute: 0 10*3/uL (ref 0–0.1)
Basophils Relative: 0 %
EOS ABS: 0.1 10*3/uL (ref 0–0.7)
Eosinophils Relative: 2 %
HCT: 48.6 % (ref 40.0–52.0)
Hemoglobin: 17.1 g/dL (ref 13.0–18.0)
Lymphocytes Relative: 44 %
Lymphs Abs: 2.8 10*3/uL (ref 1.0–3.6)
MCH: 34.3 pg — ABNORMAL HIGH (ref 26.0–34.0)
MCHC: 35.3 g/dL (ref 32.0–36.0)
MCV: 97.3 fL (ref 80.0–100.0)
MONO ABS: 0.7 10*3/uL (ref 0.2–1.0)
MONOS PCT: 11 %
Neutro Abs: 2.8 10*3/uL (ref 1.4–6.5)
Neutrophils Relative %: 43 %
Platelets: 134 10*3/uL — ABNORMAL LOW (ref 150–440)
RBC: 4.99 MIL/uL (ref 4.40–5.90)
RDW: 13.1 % (ref 11.5–14.5)
WBC: 6.4 10*3/uL (ref 3.8–10.6)

## 2018-06-13 LAB — BASIC METABOLIC PANEL
Anion gap: 6 (ref 5–15)
BUN: 16 mg/dL (ref 6–20)
CHLORIDE: 105 mmol/L (ref 98–111)
CO2: 26 mmol/L (ref 22–32)
CREATININE: 0.93 mg/dL (ref 0.61–1.24)
Calcium: 9.2 mg/dL (ref 8.9–10.3)
GFR calc Af Amer: >60 mL/min (ref >60)
GFR calc non Af Amer: >60 mL/min (ref >60)
Glucose, Bld: 96 mg/dL (ref 70–99)
Potassium: 4.4 mmol/L (ref 3.5–5.1)
SODIUM: 137 mmol/L (ref 135–145)

## 2018-06-13 MED ORDER — PROCHLORPERAZINE EDISYLATE 10 MG/2ML IJ SOLN
10.0000 mg | Freq: Once | INTRAMUSCULAR | Status: AC
  Administered 2018-06-13: 10 mg via INTRAVENOUS
  Filled 2018-06-13: qty 2

## 2018-06-13 MED ORDER — SODIUM CHLORIDE 0.9 % IV BOLUS
1000.0000 mL | Freq: Once | INTRAVENOUS | Status: AC
  Administered 2018-06-13: 1000 mL via INTRAVENOUS

## 2018-06-13 NOTE — ED Triage Notes (Signed)
Headache since yesterday.  Says hurts to open eyes. Has been trying ibuprofen which help some.  Has had headaches before, but never this bad.

## 2018-06-13 NOTE — Discharge Instructions (Addendum)
Please return for worse headache vomiting dizziness or fever.  Follow-up with your regular doctor in the next week or so with your daily headaches continue.  CT only showed some mild viral looking sinus infection.  This should go away after about a week.  If you are still have a lot of sinus pressure you can try some over-the-counter decongestant for a few days or, even better would be some salt water nasal spray 5 or 6 times a day to help decongest your nose.
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
## 2019-02-25 IMAGING — CT CT HEAD W/O CM
3 series · 16 of 45 positions shown, 19 images · non-contrast
Comparison: None.

CLINICAL DATA: Headache with photosensitivity

EXAM:
CT HEAD WITHOUT CONTRAST
TECHNIQUE: Contiguous axial images were obtained from the base of the skull
through the vertex without intravenous contrast.

[Series 2: head wo · axial · 0.42mm/px · z∈[-143,-28]mm · 10 of 28 slices shown, 13 images]
[im 3/28  brain]
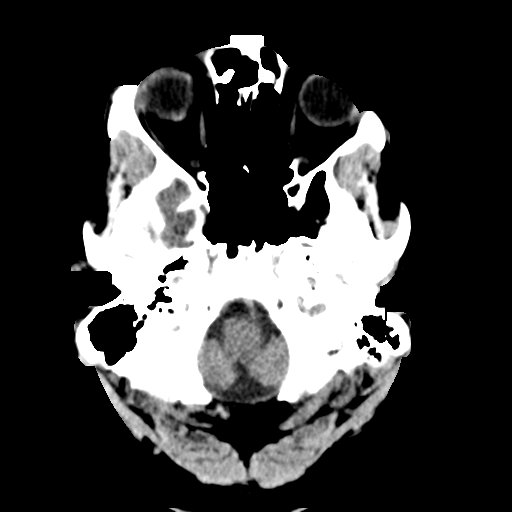
[im 3/28  bone]
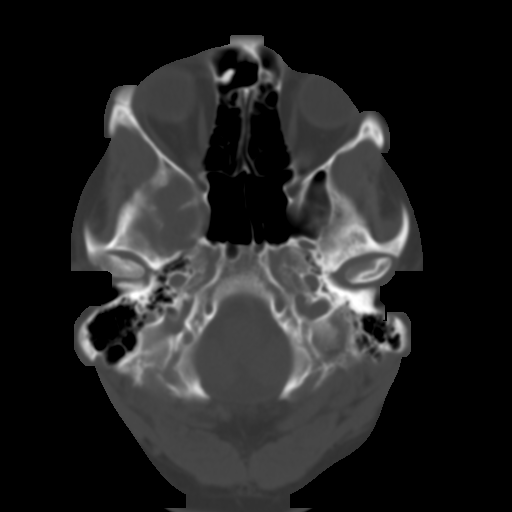
[im 5/28  brain]
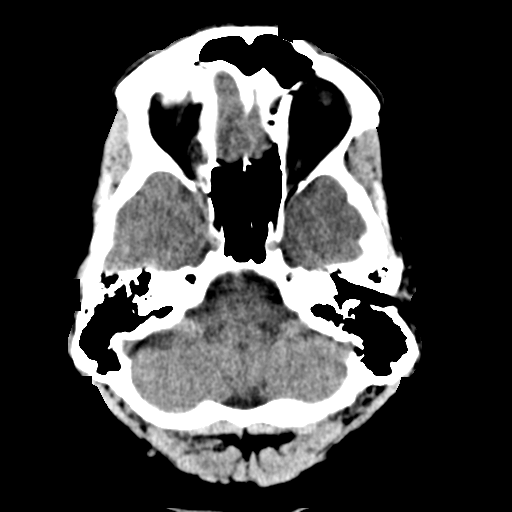
[im 8/28  brain]
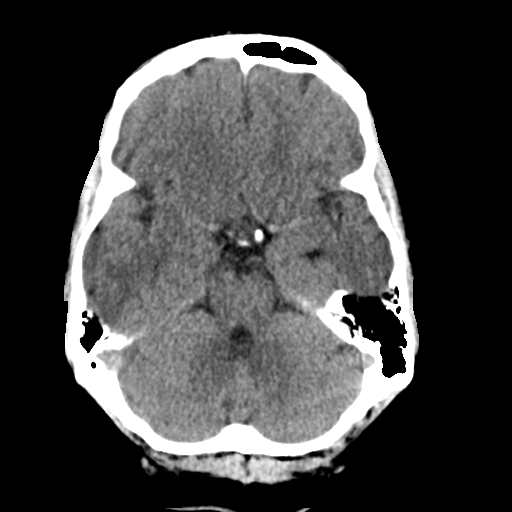
[im 11/28  brain]
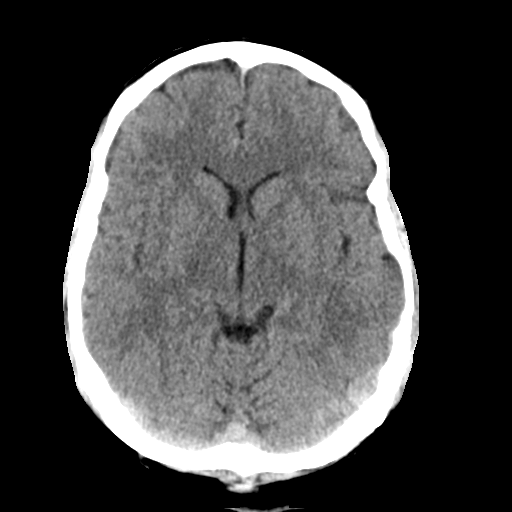
[im 13/28  brain]
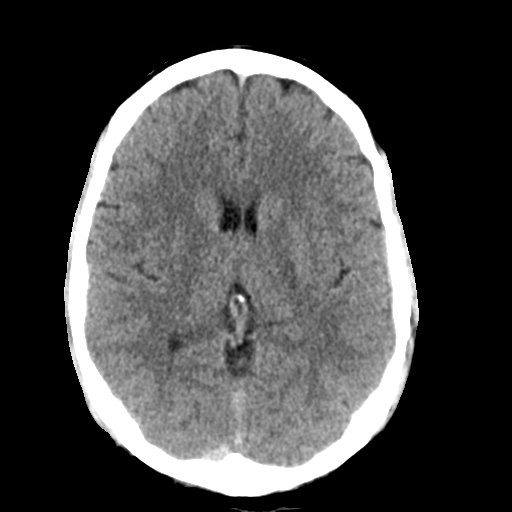
[im 13/28  bone]
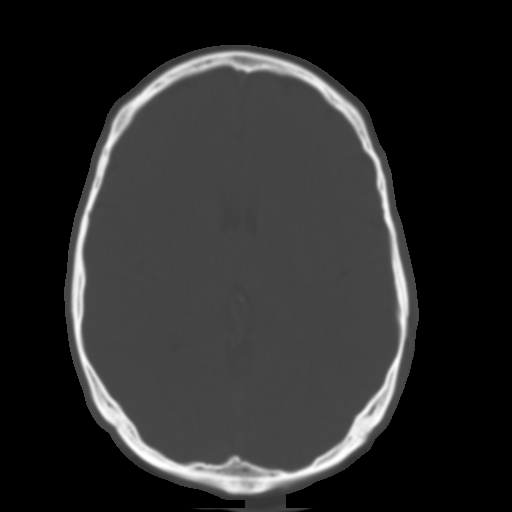
[im 16/28  brain]
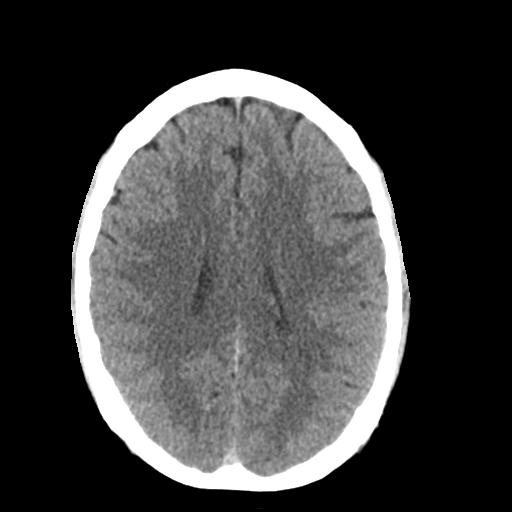
[im 18/28  brain]
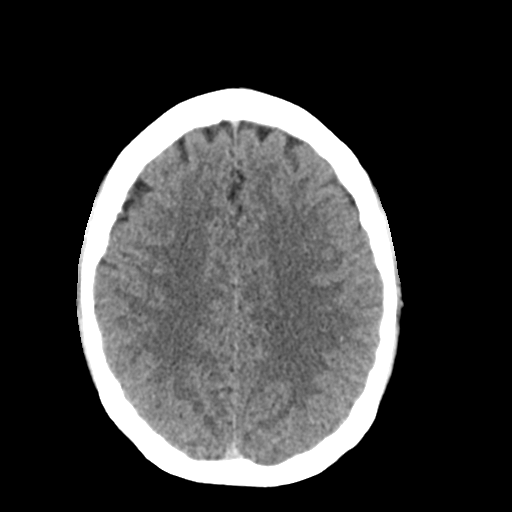
[im 21/28  brain]
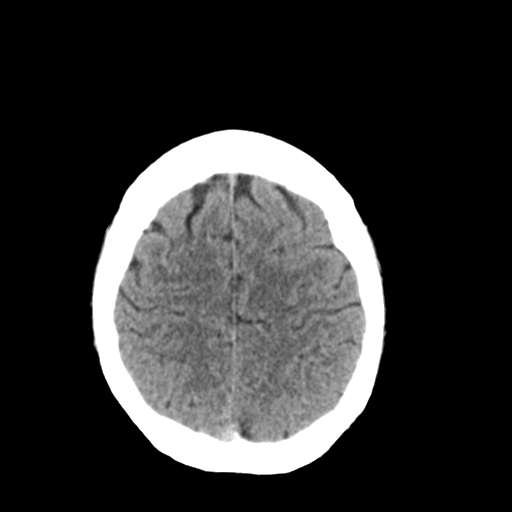
[im 24/28  brain]
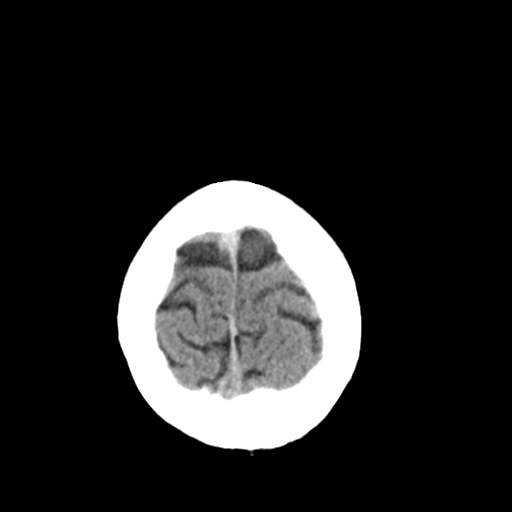
[im 24/28  bone]
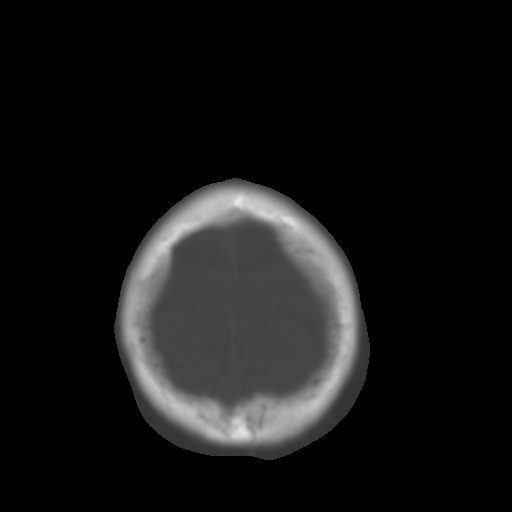
[im 26/28  brain]
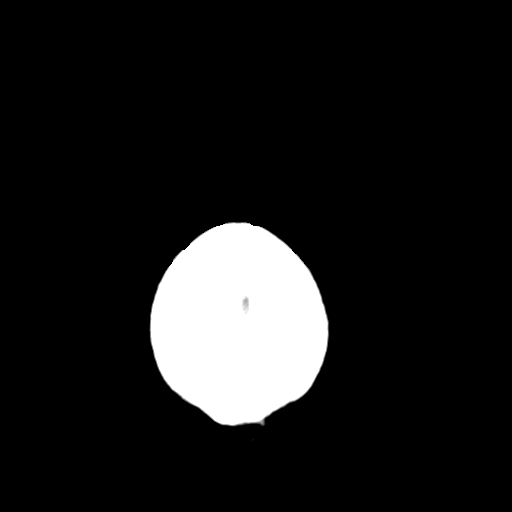

[Series 4: coronal soft tissue · coronal · 0.31mm/px · 3 of 68 slices shown]
[im 23/68  brain]
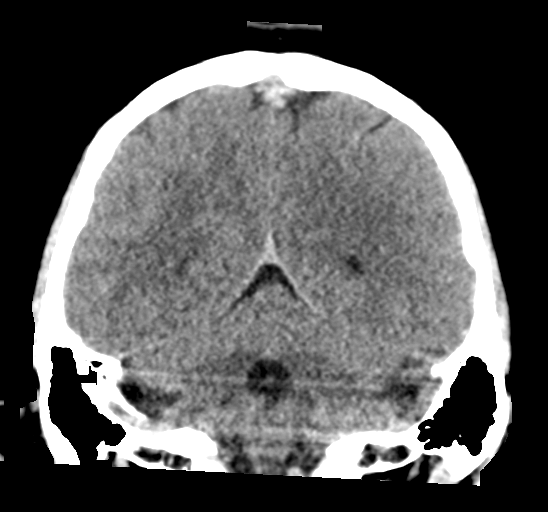
[im 30/68  brain]
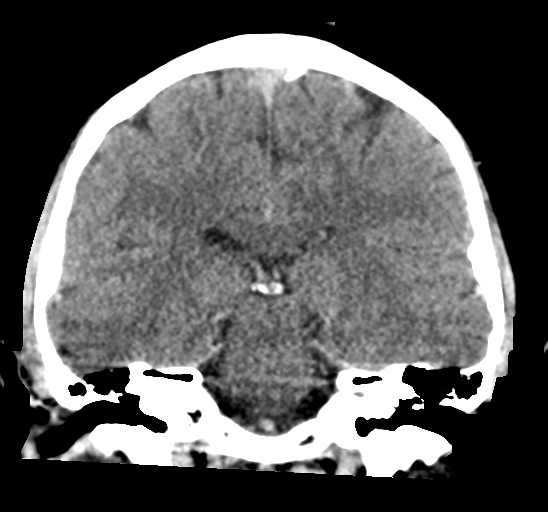
[im 38/68  brain]
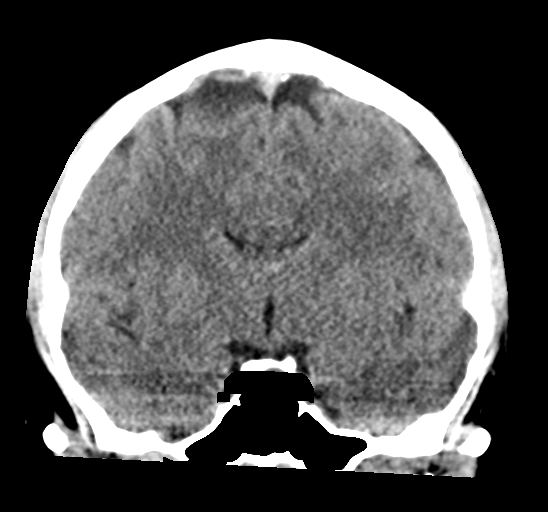

[Series 5: sagittal soft tissue · sagittal · 0.31mm/px · 3 of 57 slices shown]
[im 19/57  brain]
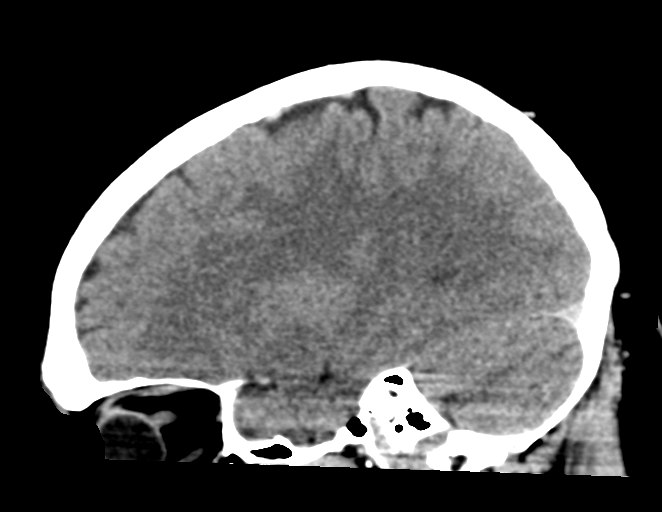
[im 29/57  brain]
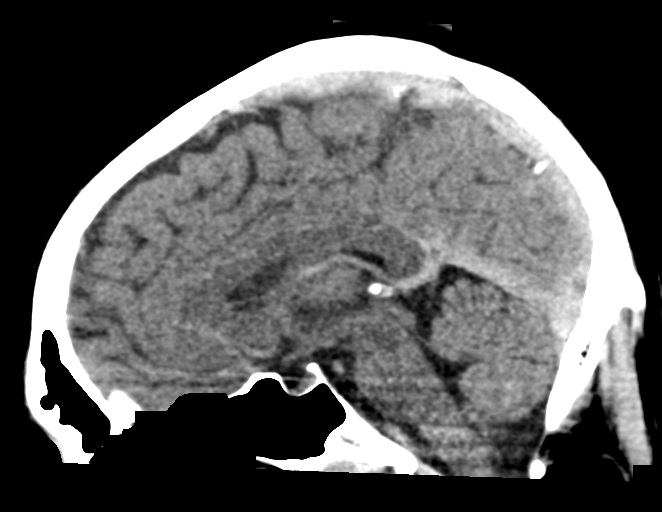
[im 38/57  brain]
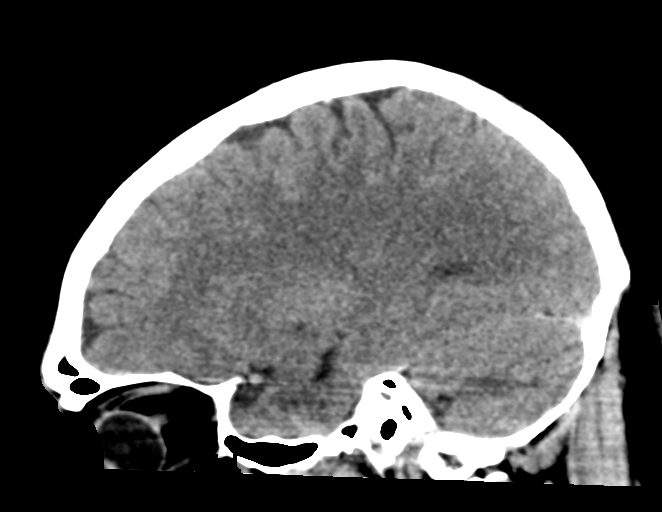

[16 of 45 positions shown; findings below may reference images not displayed]

FINDINGS: Brain: The ventricles are normal in size and configuration. There is
no intracranial mass, hemorrhage, extra-axial fluid collection, or
midline shift. Gray-white compartments appear normal. No acute
infarct evident.

Vascular: No hyperdense vessel. No appreciable vascular
calcification evident.

Skull: The bony calvarium appears intact.

Sinuses/Orbits: There is mild mucosal thickening in several ethmoid
air cells. Other visualized paranasal sinuses are clear. Visualized
orbits appear symmetric bilaterally.

Other: Visualized mastoid air cells are clear.
IMPRESSION: Mucosal thickening in several ethmoid air cells. Study otherwise
unremarkable.
